# Patient Record
Sex: Female | Born: 1975 | Race: White | Hispanic: No | Marital: Married | Smoking: Never smoker
Health system: Southern US, Community
[De-identification: ages and names within clinical notes are randomized; demographics above are authoritative.]

## PROBLEM LIST (undated history)

## (undated) DIAGNOSIS — R011 Cardiac murmur, unspecified: Secondary | ICD-10-CM

## (undated) DIAGNOSIS — F32A Depression, unspecified: Secondary | ICD-10-CM

## (undated) DIAGNOSIS — F329 Major depressive disorder, single episode, unspecified: Secondary | ICD-10-CM

## (undated) DIAGNOSIS — T7840XA Allergy, unspecified, initial encounter: Secondary | ICD-10-CM

## (undated) DIAGNOSIS — E785 Hyperlipidemia, unspecified: Secondary | ICD-10-CM

## (undated) DIAGNOSIS — G4730 Sleep apnea, unspecified: Secondary | ICD-10-CM

## (undated) HISTORY — DX: Allergy, unspecified, initial encounter: T78.40XA

## (undated) HISTORY — DX: Sleep apnea, unspecified: G47.30

## (undated) HISTORY — DX: Cardiac murmur, unspecified: R01.1

## (undated) HISTORY — DX: Depression, unspecified: F32.A

## (undated) HISTORY — DX: Hyperlipidemia, unspecified: E78.5

---
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
## 2019-03-08 HISTORY — PX: APPENDECTOMY: SHX54

## 2019-04-01 ENCOUNTER — Other Ambulatory Visit: Payer: Self-pay

## 2019-04-02 ENCOUNTER — Encounter: Payer: Self-pay | Admitting: Family Medicine

## 2019-04-02 ENCOUNTER — Other Ambulatory Visit (HOSPITAL_COMMUNITY)
Admission: RE | Admit: 2019-04-02 | Discharge: 2019-04-02 | Disposition: A | Discharge status: Home / Self Care | Payer: Federal, State, Local not specified - PPO | Source: Ambulatory Visit | Attending: Family Medicine | Admitting: Family Medicine

## 2019-04-02 ENCOUNTER — Ambulatory Visit (INDEPENDENT_AMBULATORY_CARE_PROVIDER_SITE_OTHER): Payer: Federal, State, Local not specified - PPO | Admitting: Family Medicine

## 2019-04-02 VITALS — BP 124/78 | HR 81 | Temp 97.8°F | Ht 65.0 in | Wt 206.4 lb

## 2019-04-02 DIAGNOSIS — Z23 Encounter for immunization: Secondary | ICD-10-CM | POA: Diagnosis not present

## 2019-04-02 DIAGNOSIS — Z1231 Encounter for screening mammogram for malignant neoplasm of breast: Secondary | ICD-10-CM

## 2019-04-02 DIAGNOSIS — Z3041 Encounter for surveillance of contraceptive pills: Secondary | ICD-10-CM

## 2019-04-02 DIAGNOSIS — Z0000 Encounter for general adult medical examination without abnormal findings: Secondary | ICD-10-CM

## 2019-04-02 DIAGNOSIS — Z124 Encounter for screening for malignant neoplasm of cervix: Secondary | ICD-10-CM

## 2019-04-02 DIAGNOSIS — F339 Major depressive disorder, recurrent, unspecified: Secondary | ICD-10-CM

## 2019-04-02 LAB — COMPREHENSIVE METABOLIC PANEL
ALT: 10 U/L (ref 0–35)
AST: 15 U/L (ref 0–37)
Albumin: 4.1 g/dL (ref 3.5–5.2)
Alkaline Phosphatase: 72 U/L (ref 39–117)
BUN: 11 mg/dL (ref 6–23)
CO2: 28 mEq/L (ref 19–32)
Calcium: 9 mg/dL (ref 8.4–10.5)
Chloride: 98 mEq/L (ref 96–112)
Creatinine, Ser: 0.76 mg/dL (ref 0.40–1.20)
GFR: 82.73 mL/min (ref >60.00)
Glucose, Bld: 84 mg/dL (ref 70–99)
Potassium: 4.2 mEq/L (ref 3.5–5.1)
Sodium: 132 mEq/L — ABNORMAL LOW (ref 135–145)
Total Bilirubin: 0.3 mg/dL (ref 0.2–1.2)
Total Protein: 7.3 g/dL (ref 6.0–8.3)

## 2019-04-02 LAB — LIPID PANEL
Cholesterol: 234 mg/dL — ABNORMAL HIGH (ref 0–200)
HDL: 63.9 mg/dL (ref >39.00)
LDL Cholesterol: 136 mg/dL — ABNORMAL HIGH (ref 0–99)
NonHDL: 170.29
Total CHOL/HDL Ratio: 4
Triglycerides: 170 mg/dL — ABNORMAL HIGH (ref 0.0–149.0)
VLDL: 34 mg/dL (ref 0.0–40.0)

## 2019-04-02 LAB — CBC WITH DIFFERENTIAL/PLATELET
Basophils Absolute: 0.1 10*3/uL (ref 0.0–0.1)
Basophils Relative: 0.8 % (ref 0.0–3.0)
Eosinophils Absolute: 0.1 10*3/uL (ref 0.0–0.7)
Eosinophils Relative: 1.2 % (ref 0.0–5.0)
HCT: 37.1 % (ref 36.0–46.0)
Hemoglobin: 11.9 g/dL — ABNORMAL LOW (ref 12.0–15.0)
Lymphocytes Relative: 24.5 % (ref 12.0–46.0)
Lymphs Abs: 2.9 10*3/uL (ref 0.7–4.0)
MCHC: 32.1 g/dL (ref 30.0–36.0)
MCV: 77.4 fl — ABNORMAL LOW (ref 78.0–100.0)
Monocytes Absolute: 0.5 10*3/uL (ref 0.1–1.0)
Monocytes Relative: 4.4 % (ref 3.0–12.0)
Neutro Abs: 8.2 10*3/uL — ABNORMAL HIGH (ref 1.4–7.7)
Neutrophils Relative %: 69.1 % (ref 43.0–77.0)
Platelets: 390 10*3/uL (ref 150.0–400.0)
RBC: 4.8 Mil/uL (ref 3.87–5.11)
RDW: 13.9 % (ref 11.5–15.5)
WBC: 11.8 10*3/uL — ABNORMAL HIGH (ref 4.0–10.5)

## 2019-04-02 LAB — TSH: TSH: 1.71 u[IU]/mL (ref 0.35–4.50)

## 2019-04-02 MED ORDER — NORGESTIMATE-ETH ESTRADIOL 0.25-35 MG-MCG PO TABS: 1.0000 | ORAL_TABLET | Freq: Every day | ORAL | 3 refills | Status: DC

## 2019-04-02 NOTE — Progress Notes (Signed)
Subjective  Chief Complaint  Patient presents with  . Verrucous Vulgaris    left thumb  . Contraception  . Annual Exam    HPI: Elaine Casey is a 44 y.o. female who presents to [facility] today for a Female Wellness Visit.  She also has the concerns and/or needs as listed above in the chief complaint. These will be addressed in addition to the Health Maintenance Visit.   Wellness Visit: annual visit with health maintenance review and exam with Pap   HM: due pap. No h/o abnl. Due mammogram. Married with 2 daughters. Uses ocp w/o problems. Needs refill. Due tdap Chronic disease management visit and/or acute problem visit:  Depression: chronic problem now well controlled and managed by Dr. Toy Care.   Assessment  1. Annual physical exam   2. Oral contraceptive pill surveillance   3. Cervical cancer screening   4. Encounter for screening mammogram for breast cancer   5. Major depression, recurrent, chronic ([place])   6. Need for tetanus, diphtheria, and acellular pertussis (Tdap) vaccine      Plan  Female Wellness Visit:  Age appropriate Health Maintenance and Prevention measures were discussed with patient. Included topics are cancer screening recommendations, ways to keep healthy (see AVS) including dietary and exercise recommendations, regular eye and dental care, use of seat belts, and avoidance of moderate alcohol use and tobacco use. Pap w/ HR hPV today. Ordered mammo  BMI: discussed patient's BMI and encouraged positive lifestyle modifications to help get to or maintain a target BMI.  HM needs and immunizations were addressed and ordered. See below for orders. See HM and immunization section for updates. tdap today  Routine labs and screening tests ordered including cmp, cbc and lipids where appropriate.  Discussed recommendations regarding Vit D and calcium supplementation (see AVS)  Chronic disease f/u and/or acute problem visit: (deemed  necessary to be done in addition to the wellness visit):  depression:  Controlled per psych.  Refilled ocps.   Follow up: Return for recheck wart.   Orders Placed This Encounter  Procedures  . MM DIGITAL SCREENING BILATERAL  . Tdap vaccine greater than or equal to 7yo IM  . CBC with Differential/Platelet  . Comprehensive metabolic panel  . Lipid panel  . TSH   Meds ordered this encounter  Medications  . norgestimate-ethinyl estradiol (ORTHO-CYCLEN) 0.25-35 MG-MCG tablet    Sig: Take 1 tablet by mouth daily.    Dispense:  3 Package    Refill:  3      Lifestyle: Body mass index is 34.35 kg/m. Wt Readings from Last 3 Encounters:  04/02/19 206 lb 6.4 oz (93.6 kg)    There are no problems to display for this patient.  Health Maintenance  Topic Date Due  . HIV Screening  05/22/1990  . PAP SMEAR-Modifier  05/21/1996  . TETANUS/TDAP  04/01/2029  . INFLUENZA VACCINE  Completed   Immunization History  Administered Date(s) Administered  . Influenza,inj,Quad PF,6+ Mos 01/04/2019  . Tdap 04/02/2019   We updated and reviewed the patient's past history in detail and it is documented below. Allergies: Patient  reports no history of alcohol use. Past Medical History Patient  has a past medical history of Allergy, Depression, and Heart murmur. Past Surgical History Patient  has no past surgical history on file. Social History   Socioeconomic History  . Marital status: Married    Spouse name: Not on file  . Number of children: 2  .  Years of education: Not on file  . Highest education level: Not on file  Occupational History  . Occupation: psychotherapist  Tobacco Use  . Smoking status: Never Smoker  . Smokeless tobacco: Never Used  Substance and Sexual Activity  . Alcohol use: Never  . Drug use: Never  . Sexual activity: Yes    Birth control/protection: Pill  Other Topics Concern  . Not on file  Social History Narrative   Originally from [place], [place]. Moved from  [place] in 2017; prior to that had lived in [place] for about 6 years.   Husband is an Forensic psychologist.    2 daughters.    Social Determinants of Health   Financial Resource Strain:   . Difficulty of Paying Living Expenses: Not on file  Food Insecurity:   . Worried About Charity fundraiser in the Last Year: Not on file  . Ran Out of Food in the Last Year: Not on file  Transportation Needs:   . Lack of Transportation (Medical): Not on file  . Lack of Transportation (Non-Medical): Not on file  Physical Activity:   . Days of Exercise per Week: Not on file  . Minutes of Exercise per Session: Not on file  Stress:   . Feeling of Stress : Not on file  Social Connections:   . Frequency of Communication with Friends and Family: Not on file  . Frequency of Social Gatherings with Friends and Family: Not on file  . Attends Religious Services: Not on file  . Active Member of Clubs or Organizations: Not on file  . Attends Archivist Meetings: Not on file  . Marital Status: Not on file   Family History  Problem Relation Age of Onset  . Diabetes Mother   . High Cholesterol Mother   . High blood pressure Mother   . COPD Father   . Depression Father   . Learning disabilities Father   . Healthy Daughter   . Healthy Daughter   . Cancer Maternal Grandmother   . Diabetes Maternal Grandmother   . Heart disease Maternal Grandmother   . High Cholesterol Maternal Grandmother   . Stroke Maternal Grandmother   . High blood pressure Maternal Grandmother     Review of Systems: Constitutional: negative for fever or malaise Ophthalmic: negative for photophobia, double vision or loss of vision Cardiovascular: negative for chest pain, dyspnea on exertion, or new LE swelling Respiratory: negative for SOB or persistent cough Gastrointestinal: negative for abdominal pain, change in bowel habits or melena Genitourinary: negative for dysuria or gross hematuria, no abnormal uterine bleeding or  disharge Musculoskeletal: negative for new gait disturbance or muscular weakness Integumentary: negative for new or persistent rashes, no breast lumps Neurological: negative for TIA or stroke symptoms Psychiatric: negative for SI or delusions Allergic/Immunologic: negative for hives  Patient Care Team    Relationship Specialty Notifications Start End  Leamon Arnt, MD PCP - General Family Medicine  04/02/19   Chucky May, MD Consulting Physician Psychiatry  04/02/19     Objective  Vitals: BP 124/78 (BP Location: Right Arm, Patient Position: Sitting, Cuff Size: Normal)   Pulse 81   Temp 97.8 F (36.6 C) (Temporal)   Ht 5\' 5"  (1.651 m)   Wt 206 lb 6.4 oz (93.6 kg)   LMP 03/27/2019   SpO2 99%   BMI 34.35 kg/m  General:  Well developed, well nourished, no acute distress  Psych:  Alert and orientedx3,normal mood and affect HEENT:  Normocephalic, atraumatic, non-icteric sclera, PERRL, oropharynx is clear without mass or exudate, supple neck without adenopathy, mass or thyromegaly Cardiovascular:  Normal S1, S2, RRR without gallop, rub or murmur, nondisplaced PMI Respiratory:  Good breath sounds bilaterally, CTAB with normal respiratory effort Gastrointestinal: normal bowel sounds, soft, non-tender, no noted masses. No HSM MSK: no deformities, contusions. Joints are without erythema or swelling. Spine and CVA region are nontender Skin:  Warm, no rashes or suspicious lesions noted Neurologic:    Mental status is normal. CN 2-11 are normal. Gross motor and sensory exams are normal. Normal gait. No tremor Breast Exam: No mass, skin retraction or nipple discharge is appreciated in either breast. No axillary adenopathy. Fibrocystic changes are not noted Pelvic Exam: Normal external genitalia, no vulvar or vaginal lesions present. Clear cervix w/o CMT. Bimanual exam reveals a nontender fundus w/o masses, nl size. No adnexal masses present. No inguinal adenopathy. A PAP smear was performed.      Commons side effects, risks, benefits, and alternatives for medications and treatment plan prescribed today were discussed, and the patient expressed understanding of the given instructions. Patient is instructed to call or message via MyChart if he/she has any questions or concerns regarding our treatment plan. No barriers to understanding were identified. We discussed Red Flag symptoms and signs in detail. Patient expressed understanding regarding what to do in case of urgent or emergency type symptoms.   Medication list was reconciled, printed and provided to the patient in AVS. Patient instructions and summary information was reviewed with the patient as documented in the AVS. This note was prepared with assistance of Dragon voice recognition software. Occasional wrong-word or sound-a-like substitutions may have occurred due to the inherent limitations of voice recognition software  This visit occurred during the SARS-CoV-2 public health emergency.  Safety protocols were in place, including screening questions prior to the visit, additional usage of staff PPE, and extensive cleaning of exam room while observing appropriate contact time as indicated for disinfecting solutions.

## 2019-04-02 NOTE — Patient Instructions (Addendum)
Please return in 3-4 weeks to recheck your wart.   Please sign up for mychart. I have sent a text with the link. I will release your lab results to you on your MyChart account with further instructions. Please reply with any questions.   I have ordered a mammogram and/or bone density for you as we discussed today: [x]   Mammogram  []   Bone Density  Please call the office checked below to schedule your appointment: Your appointment will at the following location  [x]   [facility]      Ag[facility], [place]         []   [facility]  [place], [place]  Make sure to wear two peace clothing  No lotions powders or deodorants the day of the appointment Make sure to bring picture ID and insurance card.  Bring list of medications you are currently taking including any supplements.   It was a pleasure meeting you today! Thank you for choosing [place] to meet your healthcare needs! I truly look forward to working with you. If you have any questions or concerns, please send me a message via Mychart or call the office at [phone].  Please do these things to maintain good health!   Exercise at least 30-45 minutes a day,  4-5 days a week.   Eat a low-fat diet with lots of fruits and vegetables, up to 7-9 servings per day.  Drink plenty of water daily. Try to drink 8 8oz glasses per day.  Seatbelts can save your life. Always wear your seatbelt.  Place Smoke Detectors on every level of your home and check batteries every year.  Schedule an appointment with an eye doctor for an eye exam every 1-2 years  Safe sex - use condoms to protect yourself from STDs if you could be exposed to these types of infections. Use birth control if you do not want to become pregnant and are sexually active.  Avoid heavy alcohol use. If you drink, keep it to less than 2 drinks/day and not every day.  [employer].  Choose someone you trust that could speak for you if you became unable to speak for yourself.  Depression is common in our stressful world.If you're feeling down or losing interest in things you normally enjoy, please come in for a visit.  If anyone is threatening or hurting you, please get help. Physical or Emotional Violence is never OK.

## 2019-04-04 LAB — CYTOLOGY - PAP
Comment: NEGATIVE
Diagnosis: NEGATIVE
High risk HPV: NEGATIVE

## 2019-04-06 ENCOUNTER — Encounter: Payer: Self-pay | Admitting: Family Medicine

## 2019-04-23 ENCOUNTER — Ambulatory Visit: Payer: Federal, State, Local not specified - PPO | Admitting: Family Medicine

## 2019-04-26 ENCOUNTER — Other Ambulatory Visit: Payer: Self-pay | Admitting: Family Medicine

## 2019-04-26 DIAGNOSIS — Z1231 Encounter for screening mammogram for malignant neoplasm of breast: Secondary | ICD-10-CM

## 2019-05-03 ENCOUNTER — Other Ambulatory Visit: Payer: Self-pay

## 2019-05-03 ENCOUNTER — Ambulatory Visit
Admission: RE | Admit: 2019-05-03 | Discharge: 2019-05-03 | Disposition: A | Discharge status: Home / Self Care | Payer: Federal, State, Local not specified - PPO | Source: Ambulatory Visit

## 2019-05-03 DIAGNOSIS — Z1231 Encounter for screening mammogram for malignant neoplasm of breast: Secondary | ICD-10-CM

## 2019-05-21 ENCOUNTER — Encounter: Payer: Self-pay | Admitting: Family Medicine

## 2019-05-24 ENCOUNTER — Ambulatory Visit: Payer: Federal, State, Local not specified - PPO | Admitting: Family Medicine

## 2019-05-31 ENCOUNTER — Ambulatory Visit: Payer: Federal, State, Local not specified - PPO | Admitting: Family Medicine

## 2019-07-10 ENCOUNTER — Encounter: Payer: Self-pay | Admitting: Family Medicine

## 2019-07-10 ENCOUNTER — Other Ambulatory Visit: Payer: Self-pay

## 2019-07-10 DIAGNOSIS — R0683 Snoring: Secondary | ICD-10-CM

## 2019-07-22 ENCOUNTER — Ambulatory Visit: Payer: Federal, State, Local not specified - PPO | Admitting: Family Medicine

## 2019-07-22 DIAGNOSIS — Z0289 Encounter for other administrative examinations: Secondary | ICD-10-CM

## 2019-08-16 ENCOUNTER — Encounter: Payer: Self-pay | Admitting: Family Medicine

## 2019-09-02 ENCOUNTER — Encounter: Payer: Self-pay | Admitting: Pulmonary Disease

## 2019-09-02 ENCOUNTER — Other Ambulatory Visit: Payer: Self-pay

## 2019-09-02 ENCOUNTER — Ambulatory Visit: Payer: Federal, State, Local not specified - PPO | Admitting: Pulmonary Disease

## 2019-09-02 VITALS — BP 120/68 | HR 84 | Temp 98.2°F | Ht 66.0 in | Wt 206.6 lb

## 2019-09-02 DIAGNOSIS — G4733 Obstructive sleep apnea (adult) (pediatric): Secondary | ICD-10-CM | POA: Insufficient documentation

## 2019-09-02 NOTE — Progress Notes (Signed)
Subjective:    Patient ID: Elaine Casey, female    DOB: 1976-02-18, 44 y.o.   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 Snoring has been noted by her husband for many years.  She now reports excessive daytime fatigue and some level of drowsiness. Epworth sleepiness score is 7 and she reports sleepiness in the afternoons or while sitting and reading.  On weekends she will take naps up to 2 hours and these are refreshing. 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 Bedtime is between 11 PM and midnight, sleep latency is minimal, she sleeps on her right side with 1 pillow, reports 1-2 nocturnal awakenings including nocturia and is out of bed at 7:30 AM feeling refreshed without headaches or dryness of mouth. Her weight has been steady over the past 3 years at around 200 pounds. She drinks [employer], lactate 3-4 times per week and 1-2 sodas per day. There is no history suggestive of cataplexy, sleep paralysis or parasomnias  She shows me a recording of her snoring on her sleep shut eye app, seems to be continuous snoring without paralysis    Past Medical History:  Diagnosis Date   Allergy    Depression    Heart murmur      History reviewed. No pertinent surgical history.  No Known Allergies  Social History   Socioeconomic History   Marital status: Married    Spouse name: Not on file   Number of children: 2   Years of education: Not on file   Highest education level: Not on file  Occupational History   Occupation: psychotherapist  Tobacco Use   Smoking status: Never Smoker   Smokeless tobacco: Never Used  Substance and Sexual Activity   Alcohol use: Never   Drug use: Never   Sexual activity: Yes    Birth control/protection: Pill    Other Topics Concern   Not on file  Social History Narrative   Originally from [place], [place]. Moved from [place] in 2017; prior to that had lived in [place] for about 6 years.   Husband is an Forensic psychologist.    2 daughters.    Social Determinants of Health   Financial Resource Strain:    Difficulty of Paying Living Expenses:   Food Insecurity:    Worried About Charity fundraiser in the Last Year:    Arboriculturist in the Last Year:   Transportation Needs:    Film/video editor (Medical):    Lack of Transportation (Non-Medical):   Physical Activity:    Days of Exercise per Week:    Minutes of Exercise per Session:   Stress:    Feeling of Stress :   Social Connections:    Frequency of Communication with Friends and Family:    Frequency of Social Gatherings with Friends and Family:    Attends Religious Services:    Active Member of Clubs or Organizations:    Attends Music therapist:    Marital Status:   Intimate Partner Violence:    Fear of Current or Ex-Partner:    Emotionally Abused:    Physically Abused:    Sexually Abused:     Family History  Problem Relation Age of Onset   Diabetes Mother  High Cholesterol Mother    High blood pressure Mother    COPD Father    Depression Father    Learning disabilities Father    Healthy Daughter    Healthy Daughter    Cancer Maternal Grandmother    Diabetes Maternal Grandmother    Heart disease Maternal Grandmother    High Cholesterol Maternal Grandmother    Stroke Maternal Grandmother    High blood pressure Maternal Grandmother       Review of Systems Constitutional: negative for anorexia, fevers and sweats  Eyes: negative for irritation, redness and visual disturbance  Ears, nose, mouth, throat, and face: negative for earaches, epistaxis, nasal congestion and sore throat  Respiratory: negative for cough, dyspnea on exertion, sputum and wheezing  Cardiovascular: negative  for chest pain, dyspnea, lower extremity edema, orthopnea, palpitations and syncope  Gastrointestinal: negative for abdominal pain, constipation, diarrhea, melena, nausea and vomiting  Genitourinary:negative for dysuria, frequency and hematuria  Hematologic/lymphatic: negative for bleeding, easy bruising and lymphadenopathy  Musculoskeletal:negative for arthralgias, muscle weakness and stiff joints  Neurological: negative for coordination problems, gait problems, headaches and weakness  Endocrine: negative for diabetic symptoms including polydipsia, polyuria and weight loss     Objective:   Physical Exam   Gen. Pleasant, obese, in no distress ENT - no lesions, no post nasal drip, class 2 airway, tonsillar tissue + Neck: No JVD, no thyromegaly, no carotid bruits Lungs: no use of accessory muscles, no dullness to percussion, decreased without rales or rhonchi  Cardiovascular: Rhythm regular, heart sounds  normal, no murmurs or gallops, no peripheral edema Musculoskeletal: No deformities, no cyanosis or clubbing , no tremors        Assessment & Plan:

## 2019-09-02 NOTE — Assessment & Plan Note (Signed)
Given excessive daytime somnolence, narrow pharyngeal exam, witnessed apneas & loud snoring, obstructive sleep apnea is very likely & an overnight polysomnogram will be scheduled as a home sleep study. The pathophysiology of obstructive sleep apnea , it's cardiovascular consequences & modes of treatment including CPAP were discused with the patient in detail & they evidenced understanding.  Pretest quality is intermediate.  She does not seem to be a mouth breather so if she does need CPAP, nasal interface might suffice.  She would be willing to use a CPAP if needed.  She has significant TMJ issues and has not had much success with a nightguard so she is not very enthusiastic about a dental appliance

## 2019-09-02 NOTE — Patient Instructions (Signed)
Schedule home sleep test. We discussed treatment options Weight loss of 10 to 20 pounds encouraged

## 2019-10-08 ENCOUNTER — Other Ambulatory Visit: Payer: Self-pay

## 2019-10-08 ENCOUNTER — Ambulatory Visit: Payer: Federal, State, Local not specified - PPO

## 2019-10-08 DIAGNOSIS — G4733 Obstructive sleep apnea (adult) (pediatric): Secondary | ICD-10-CM | POA: Diagnosis not present

## 2019-10-11 ENCOUNTER — Telehealth: Payer: Self-pay | Admitting: Pulmonary Disease

## 2019-10-11 DIAGNOSIS — G4733 Obstructive sleep apnea (adult) (pediatric): Secondary | ICD-10-CM

## 2019-10-11 NOTE — Telephone Encounter (Signed)
HST showed very mild OSA, AHI 7/hour. She only slept for 6-1/2 hours  The cardiovascular implications of mild OSA are so minimal that I would not recommend cpap treatment for this degree of sleep disordered breathing. She can pursue dental appliance if her TMJ joint issues are bad  Happy to discuss with her if she has any other questions

## 2019-10-17 NOTE — Telephone Encounter (Signed)
Called and spoke with pt letting her know the results of the HST and she verbalized understanding.nothing further needed.

## 2019-10-17 NOTE — Telephone Encounter (Signed)
Patient is returning phone call. Patient phone number is [phone].

## 2019-10-30 ENCOUNTER — Encounter: Payer: Self-pay | Admitting: Family Medicine

## 2019-12-02 ENCOUNTER — Encounter: Payer: Self-pay | Admitting: Family Medicine

## 2019-12-02 ENCOUNTER — Other Ambulatory Visit: Payer: Self-pay

## 2019-12-02 ENCOUNTER — Ambulatory Visit: Payer: Federal, State, Local not specified - PPO | Admitting: Family Medicine

## 2019-12-02 VITALS — BP 120/82 | HR 91 | Temp 97.8°F | Resp 16 | Ht 66.0 in | Wt 202.6 lb

## 2019-12-02 DIAGNOSIS — Z23 Encounter for immunization: Secondary | ICD-10-CM

## 2019-12-02 DIAGNOSIS — B079 Viral wart, unspecified: Secondary | ICD-10-CM

## 2019-12-02 DIAGNOSIS — B078 Other viral warts: Secondary | ICD-10-CM

## 2019-12-02 NOTE — Progress Notes (Signed)
Subjective  CC:  Chief Complaint  Patient presents with  . Warts    left thumb and middle finger, OTC meds with no relief    HPI: Elaine Casey is a 44 y.o. female who presents to the office today to address  the problems listed above in the chief complaint.  Warts: Growing, left hand, periungual and thumb and middle finger.  She has tried multiple over-the-counter treatments including topical salicylic acid, duct tape, over-the-counter freezing kits.  None have helped.  They are unsightly and get sore and irritated due to the location. Flu shot today Assessment  1. Verruca warts (infectious)      Plan   Warts: Status post cryotherapy x2.  Tolerated well.  Post procedure care instructions given.  Repeat procedure every 2 to 3 weeks until resolution.  Follow up: 2 weeks for cryotherapy Visit date not found  No orders of the defined types were placed in this encounter.  No orders of the defined types were placed in this encounter.     I reviewed the patients updated PMH, FH, and SocHx.    Patient Active Problem List   Diagnosis Date Noted  . OSA (obstructive sleep apnea) 09/02/2019   Current Meds  Medication Sig  . buPROPion (WELLBUTRIN XL) 300 MG 24 hr tablet Take 300 mg by mouth daily.  . cholecalciferol (VITAMIN D3) 25 MCG (1000 UNIT) tablet Take 1,000 Units by mouth daily.  Marland Kitchen escitalopram (LEXAPRO) 20 MG tablet Take 20 mg by mouth daily.  . ferrous sulfate 325 (65 FE) MG tablet Take 325 mg by mouth daily with breakfast.  . lamoTRIgine (LAMICTAL) 150 MG tablet Take 150 mg by mouth daily.  . norgestimate-ethinyl estradiol (ORTHO-CYCLEN) 0.25-35 MG-MCG tablet Take 1 tablet by mouth daily.    Allergies: Patient has No Known Allergies. Family History: Patient family history includes COPD in her father; Cancer in her maternal grandmother; Depression in her father; Diabetes in her maternal grandmother and mother; Healthy in her daughter and daughter; Heart disease in  her maternal grandmother; High Cholesterol in her maternal grandmother and mother; High blood pressure in her maternal grandmother and mother; Learning disabilities in her father; Stroke in her maternal grandmother. Social History:  Patient  reports that she has never smoked. She has never used smokeless tobacco. She reports that she does not drink alcohol and does not use drugs.  Review of Systems: Constitutional: Negative for fever malaise or anorexia Cardiovascular: negative for chest pain Respiratory: negative for SOB or persistent cough Gastrointestinal: negative for abdominal pain  Objective  Vitals: BP 120/82   Pulse 91   Temp 97.8 F (36.6 C) (Temporal)   Resp 16   Ht 5\' 6"  (1.676 m)   Wt 202 lb 9.6 oz (91.9 kg)   SpO2 98%   BMI 32.70 kg/m  General: no acute distress , A&Ox3 Left hand: Middle finger media:  65mm raised verrucous lesion; 4-5 mm warty lesion medial periungual region of thumb.  Cryotherapy Procedure Note  Pre-operative Diagnosis: wart  Post-operative Diagnosis: same  Locations: see description above on left hand  Indications: pain and irritated lesions  Anesthesia: none  Procedure Details   Patient informed of risks (permanent scarring, infection, light or dark discoloration, bleeding, infection, weakness, numbness and recurrence of the lesion) and benefits of the procedure and verbal informed consent obtained. Universal time out performed  The areas are treated with liquid nitrogen therapy, frozen until ice ball extended 2 mm beyond lesion, allowed to thaw, and  treated again. The patient tolerated procedure well.  The patient was instructed on post-op care, warned that there may be blister formation, redness and pain. Recommend OTC analgesia as needed for pain.  Condition: Stable  Complications: none.    Commons side effects, risks, benefits, and alternatives for medications and treatment plan prescribed today were discussed, and the patient  expressed understanding of the given instructions. Patient is instructed to call or message via MyChart if he/she has any questions or concerns regarding our treatment plan. No barriers to understanding were identified. We discussed Red Flag symptoms and signs in detail. Patient expressed understanding regarding what to do in case of urgent or emergency type symptoms.   Medication list was reconciled, printed and provided to the patient in AVS. Patient instructions and summary information was reviewed with the patient as documented in the AVS. This note was prepared with assistance of Dragon voice recognition software. Occasional wrong-word or sound-a-like substitutions may have occurred due to the inherent limitations of voice recognition software  This visit occurred during the SARS-CoV-2 public health emergency.  Safety protocols were in place, including screening questions prior to the visit, additional usage of staff PPE, and extensive cleaning of exam room while observing appropriate contact time as indicated for disinfecting solutions.

## 2019-12-18 ENCOUNTER — Encounter: Payer: Self-pay | Admitting: Family Medicine

## 2019-12-18 ENCOUNTER — Encounter (HOSPITAL_COMMUNITY): Payer: Self-pay

## 2019-12-18 ENCOUNTER — Observation Stay (HOSPITAL_COMMUNITY): Payer: Federal, State, Local not specified - PPO | Admitting: Certified Registered Nurse Anesthetist

## 2019-12-18 ENCOUNTER — Inpatient Hospital Stay (HOSPITAL_COMMUNITY)
Admission: EM | Admit: 2019-12-18 | Discharge: 2019-12-19 | DRG: 340 | Disposition: A | Discharge status: Home / Self Care | Payer: Federal, State, Local not specified - PPO | Attending: Physician Assistant | Admitting: Physician Assistant

## 2019-12-18 ENCOUNTER — Other Ambulatory Visit: Payer: Self-pay

## 2019-12-18 ENCOUNTER — Encounter (HOSPITAL_COMMUNITY): Admission: EM | Disposition: A | Discharge status: Home / Self Care | Payer: Self-pay | Source: Home / Self Care

## 2019-12-18 ENCOUNTER — Emergency Department (HOSPITAL_COMMUNITY): Payer: Federal, State, Local not specified - PPO

## 2019-12-18 ENCOUNTER — Ambulatory Visit: Payer: Federal, State, Local not specified - PPO | Admitting: Family Medicine

## 2019-12-18 DIAGNOSIS — N7093 Salpingitis and oophoritis, unspecified: Secondary | ICD-10-CM | POA: Diagnosis present

## 2019-12-18 DIAGNOSIS — F32A Depression, unspecified: Secondary | ICD-10-CM | POA: Diagnosis present

## 2019-12-18 DIAGNOSIS — Z823 Family history of stroke: Secondary | ICD-10-CM | POA: Diagnosis not present

## 2019-12-18 DIAGNOSIS — Z83438 Family history of other disorder of lipoprotein metabolism and other lipidemia: Secondary | ICD-10-CM

## 2019-12-18 DIAGNOSIS — Z825 Family history of asthma and other chronic lower respiratory diseases: Secondary | ICD-10-CM | POA: Diagnosis not present

## 2019-12-18 DIAGNOSIS — R3911 Hesitancy of micturition: Secondary | ICD-10-CM | POA: Diagnosis present

## 2019-12-18 DIAGNOSIS — K3532 Acute appendicitis with perforation and localized peritonitis, without abscess: Secondary | ICD-10-CM | POA: Diagnosis present

## 2019-12-18 DIAGNOSIS — Z833 Family history of diabetes mellitus: Secondary | ICD-10-CM | POA: Diagnosis not present

## 2019-12-18 DIAGNOSIS — Z8249 Family history of ischemic heart disease and other diseases of the circulatory system: Secondary | ICD-10-CM

## 2019-12-18 DIAGNOSIS — K3580 Unspecified acute appendicitis: Secondary | ICD-10-CM | POA: Diagnosis present

## 2019-12-18 DIAGNOSIS — Z20822 Contact with and (suspected) exposure to covid-19: Secondary | ICD-10-CM | POA: Diagnosis present

## 2019-12-18 HISTORY — PX: LAPAROSCOPIC APPENDECTOMY: SHX408

## 2019-12-18 LAB — URINALYSIS, ROUTINE W REFLEX MICROSCOPIC
Bilirubin Urine: NEGATIVE
Glucose, UA: NEGATIVE mg/dL
Hgb urine dipstick: NEGATIVE
Ketones, ur: 20 mg/dL — AB
Leukocytes,Ua: NEGATIVE
Nitrite: NEGATIVE
Protein, ur: NEGATIVE mg/dL
Specific Gravity, Urine: 1.017 (ref 1.005–1.030)
pH: 5 (ref 5.0–8.0)

## 2019-12-18 LAB — COMPREHENSIVE METABOLIC PANEL
ALT: 12 U/L (ref 0–44)
AST: 16 U/L (ref 15–41)
Albumin: 3.5 g/dL (ref 3.5–5.0)
Alkaline Phosphatase: 62 U/L (ref 38–126)
Anion gap: 10 (ref 5–15)
BUN: 6 mg/dL (ref 6–20)
CO2: 23 mmol/L (ref 22–32)
Calcium: 8.8 mg/dL — ABNORMAL LOW (ref 8.9–10.3)
Chloride: 101 mmol/L (ref 98–111)
Creatinine, Ser: 0.87 mg/dL (ref 0.44–1.00)
GFR, Estimated: >60 mL/min (ref >60)
Glucose, Bld: 130 mg/dL — ABNORMAL HIGH (ref 70–99)
Potassium: 4 mmol/L (ref 3.5–5.1)
Sodium: 134 mmol/L — ABNORMAL LOW (ref 135–145)
Total Bilirubin: 0.6 mg/dL (ref 0.3–1.2)
Total Protein: 7 g/dL (ref 6.5–8.1)

## 2019-12-18 LAB — CREATININE, SERUM
Creatinine, Ser: 0.75 mg/dL (ref 0.44–1.00)
GFR, Estimated: >60 mL/min (ref >60)

## 2019-12-18 LAB — CBC
HCT: 38 % (ref 36.0–46.0)
HCT: 37.3 % (ref 36.0–46.0)
Hemoglobin: 11.7 g/dL — ABNORMAL LOW (ref 12.0–15.0)
Hemoglobin: 11.4 g/dL — ABNORMAL LOW (ref 12.0–15.0)
MCH: 25.5 pg — ABNORMAL LOW (ref 26.0–34.0)
MCH: 25.4 pg — ABNORMAL LOW (ref 26.0–34.0)
MCHC: 30.8 g/dL (ref 30.0–36.0)
MCHC: 30.6 g/dL (ref 30.0–36.0)
MCV: 83 fL (ref 80.0–100.0)
MCV: 83.1 fL (ref 80.0–100.0)
Platelets: 322 10*3/uL (ref 150–400)
Platelets: 298 10*3/uL (ref 150–400)
RBC: 4.58 MIL/uL (ref 3.87–5.11)
RBC: 4.49 MIL/uL (ref 3.87–5.11)
RDW: 13.5 % (ref 11.5–15.5)
RDW: 13.6 % (ref 11.5–15.5)
WBC: 26.2 10*3/uL — ABNORMAL HIGH (ref 4.0–10.5)
WBC: 23.3 10*3/uL — ABNORMAL HIGH (ref 4.0–10.5)
nRBC: 0 % (ref 0.0–0.2)
nRBC: 0 % (ref 0.0–0.2)

## 2019-12-18 LAB — I-STAT BETA HCG BLOOD, ED (MC, WL, AP ONLY): I-stat hCG, quantitative: <5 m[IU]/mL (ref <5)

## 2019-12-18 LAB — RESPIRATORY PANEL BY RT PCR (FLU A&B, COVID)
Influenza A by PCR: NEGATIVE
Influenza B by PCR: NEGATIVE
SARS Coronavirus 2 by RT PCR: NEGATIVE

## 2019-12-18 LAB — LIPASE, BLOOD: Lipase: 27 U/L (ref 11–51)

## 2019-12-18 SURGERY — APPENDECTOMY, LAPAROSCOPIC
Anesthesia: General | Site: Abdomen

## 2019-12-18 MED ORDER — BUPIVACAINE HCL (PF) 0.25 % IJ SOLN
INTRAMUSCULAR | Status: AC
  Filled 2019-12-18: qty 30

## 2019-12-18 MED ORDER — ACETAMINOPHEN 500 MG PO TABS
1000.0000 mg | ORAL_TABLET | Freq: Four times a day (QID) | ORAL | Status: DC
  Administered 2019-12-18 – 2019-12-19 (×3): 1000 mg via ORAL
  Filled 2019-12-18 (×3): qty 2

## 2019-12-18 MED ORDER — FENTANYL CITRATE (PF) 100 MCG/2ML IJ SOLN
INTRAMUSCULAR | Status: DC | PRN
Start: 2019-12-18 — End: 2019-12-18
  Administered 2019-12-18 (×3): 50 ug via INTRAVENOUS

## 2019-12-18 MED ORDER — ESCITALOPRAM OXALATE 20 MG PO TABS
20.0000 mg | ORAL_TABLET | Freq: Every day | ORAL | Status: DC
  Administered 2019-12-18 – 2019-12-19 (×2): 20 mg via ORAL
  Filled 2019-12-18 (×2): qty 1

## 2019-12-18 MED ORDER — MORPHINE SULFATE (PF) 2 MG/ML IV SOLN: 2.0000 mg | INTRAVENOUS | Status: DC | PRN

## 2019-12-18 MED ORDER — PHENYLEPHRINE 40 MCG/ML (10ML) SYRINGE FOR IV PUSH (FOR BLOOD PRESSURE SUPPORT)
PREFILLED_SYRINGE | INTRAVENOUS | Status: DC | PRN
  Administered 2019-12-18 (×2): 80 ug via INTRAVENOUS

## 2019-12-18 MED ORDER — MIDAZOLAM HCL 2 MG/2ML IJ SOLN
INTRAMUSCULAR | Status: AC
  Filled 2019-12-18: qty 2

## 2019-12-18 MED ORDER — LIDOCAINE 2% (20 MG/ML) 5 ML SYRINGE
INTRAMUSCULAR | Status: AC
  Filled 2019-12-18: qty 5

## 2019-12-18 MED ORDER — ENOXAPARIN SODIUM 40 MG/0.4ML ~~LOC~~ SOLN: 40.0000 mg | SUBCUTANEOUS | Status: DC

## 2019-12-18 MED ORDER — FENTANYL CITRATE (PF) 100 MCG/2ML IJ SOLN
50.0000 ug | Freq: Once | INTRAMUSCULAR | Status: AC
  Administered 2019-12-18: 50 ug via INTRAVENOUS
  Filled 2019-12-18: qty 2

## 2019-12-18 MED ORDER — LIDOCAINE 2% (20 MG/ML) 5 ML SYRINGE
INTRAMUSCULAR | Status: DC | PRN
  Administered 2019-12-18: 60 mg via INTRAVENOUS

## 2019-12-18 MED ORDER — PIPERACILLIN-TAZOBACTAM 3.375 G IVPB
3.3750 g | Freq: Three times a day (TID) | INTRAVENOUS | Status: DC
  Administered 2019-12-18 – 2019-12-19 (×3): 3.375 g via INTRAVENOUS
  Filled 2019-12-18 (×3): qty 50

## 2019-12-18 MED ORDER — SODIUM CHLORIDE 0.9 % IV SOLN: INTRAVENOUS | Status: DC

## 2019-12-18 MED ORDER — OXYCODONE-ACETAMINOPHEN 5-325 MG PO TABS
1.0000 | ORAL_TABLET | ORAL | Status: DC | PRN
  Administered 2019-12-18: 1 via ORAL
  Filled 2019-12-18: qty 1

## 2019-12-18 MED ORDER — KETOROLAC TROMETHAMINE 30 MG/ML IJ SOLN
30.0000 mg | Freq: Four times a day (QID) | INTRAMUSCULAR | Status: DC | PRN
  Administered 2019-12-18: 30 mg via INTRAVENOUS
  Filled 2019-12-18: qty 1

## 2019-12-18 MED ORDER — PHENYLEPHRINE HCL-NACL 10-0.9 MG/250ML-% IV SOLN
INTRAVENOUS | Status: DC | PRN
  Administered 2019-12-18: 25 ug/min via INTRAVENOUS

## 2019-12-18 MED ORDER — IOHEXOL 300 MG/ML  SOLN
80.0000 mL | Freq: Once | INTRAMUSCULAR | Status: AC | PRN
  Administered 2019-12-18: 80 mL via INTRAVENOUS

## 2019-12-18 MED ORDER — METOPROLOL TARTRATE 5 MG/5ML IV SOLN: 5.0000 mg | Freq: Four times a day (QID) | INTRAVENOUS | Status: DC | PRN

## 2019-12-18 MED ORDER — NORGESTIMATE-ETH ESTRADIOL 0.25-35 MG-MCG PO TABS: 1.0000 | ORAL_TABLET | Freq: Every day | ORAL | Status: DC

## 2019-12-18 MED ORDER — CHLORHEXIDINE GLUCONATE 0.12 % MT SOLN: 15.0000 mL | Freq: Once | OROMUCOSAL | Status: AC

## 2019-12-18 MED ORDER — SUGAMMADEX SODIUM 200 MG/2ML IV SOLN
INTRAVENOUS | Status: DC | PRN
  Administered 2019-12-18: 200 mg via INTRAVENOUS

## 2019-12-18 MED ORDER — ROCURONIUM BROMIDE 10 MG/ML (PF) SYRINGE
PREFILLED_SYRINGE | INTRAVENOUS | Status: DC | PRN
  Administered 2019-12-18: 30 mg via INTRAVENOUS

## 2019-12-18 MED ORDER — OXYCODONE HCL 5 MG PO TABS: 5.0000 mg | ORAL_TABLET | ORAL | Status: DC | PRN

## 2019-12-18 MED ORDER — PROPOFOL 10 MG/ML IV BOLUS
INTRAVENOUS | Status: AC
  Filled 2019-12-18: qty 20

## 2019-12-18 MED ORDER — ACETAMINOPHEN 10 MG/ML IV SOLN
INTRAVENOUS | Status: AC
  Filled 2019-12-18: qty 100

## 2019-12-18 MED ORDER — ONDANSETRON HCL 4 MG/2ML IJ SOLN
INTRAMUSCULAR | Status: DC | PRN
  Administered 2019-12-18: 4 mg via INTRAVENOUS

## 2019-12-18 MED ORDER — CHLORHEXIDINE GLUCONATE 0.12 % MT SOLN
OROMUCOSAL | Status: AC
  Administered 2019-12-18: 15 mL via OROMUCOSAL
  Filled 2019-12-18: qty 15

## 2019-12-18 MED ORDER — SODIUM CHLORIDE 0.9 % IR SOLN
Status: DC | PRN
  Administered 2019-12-18: 3000 mL
  Administered 2019-12-18: 1000 mL

## 2019-12-18 MED ORDER — SUCCINYLCHOLINE CHLORIDE 200 MG/10ML IV SOSY
PREFILLED_SYRINGE | INTRAVENOUS | Status: DC | PRN
  Administered 2019-12-18: 120 mg via INTRAVENOUS

## 2019-12-18 MED ORDER — DIPHENHYDRAMINE HCL 25 MG PO CAPS: 25.0000 mg | ORAL_CAPSULE | Freq: Four times a day (QID) | ORAL | Status: DC | PRN

## 2019-12-18 MED ORDER — ONDANSETRON HCL 4 MG/2ML IJ SOLN: 4.0000 mg | Freq: Four times a day (QID) | INTRAMUSCULAR | Status: DC | PRN

## 2019-12-18 MED ORDER — MORPHINE SULFATE (PF) 4 MG/ML IV SOLN
4.0000 mg | Freq: Once | INTRAVENOUS | Status: AC
  Administered 2019-12-18: 4 mg via INTRAVENOUS
  Filled 2019-12-18: qty 1

## 2019-12-18 MED ORDER — ONDANSETRON 4 MG PO TBDP
4.0000 mg | ORAL_TABLET | Freq: Once | ORAL | Status: DC | PRN
  Filled 2019-12-18: qty 1

## 2019-12-18 MED ORDER — PROPOFOL 10 MG/ML IV BOLUS
INTRAVENOUS | Status: DC | PRN
  Administered 2019-12-18: 150 mg via INTRAVENOUS

## 2019-12-18 MED ORDER — DEXAMETHASONE SODIUM PHOSPHATE 10 MG/ML IJ SOLN
INTRAMUSCULAR | Status: DC | PRN
  Administered 2019-12-18: 4 mg via INTRAVENOUS

## 2019-12-18 MED ORDER — ROCURONIUM BROMIDE 10 MG/ML (PF) SYRINGE
PREFILLED_SYRINGE | INTRAVENOUS | Status: AC
  Filled 2019-12-18: qty 10

## 2019-12-18 MED ORDER — LACTATED RINGERS IV SOLN: INTRAVENOUS | Status: DC

## 2019-12-18 MED ORDER — SCOPOLAMINE 1 MG/3DAYS TD PT72
MEDICATED_PATCH | TRANSDERMAL | Status: DC | PRN
  Administered 2019-12-18: 1 via TRANSDERMAL

## 2019-12-18 MED ORDER — ACETAMINOPHEN 10 MG/ML IV SOLN
INTRAVENOUS | Status: DC | PRN
  Administered 2019-12-18: 1000 mg via INTRAVENOUS

## 2019-12-18 MED ORDER — ONDANSETRON 4 MG PO TBDP: 4.0000 mg | ORAL_TABLET | Freq: Four times a day (QID) | ORAL | Status: DC | PRN

## 2019-12-18 MED ORDER — FENTANYL CITRATE (PF) 100 MCG/2ML IJ SOLN: 50.0000 ug | Freq: Once | INTRAMUSCULAR | Status: DC

## 2019-12-18 MED ORDER — MIDAZOLAM HCL 5 MG/5ML IJ SOLN
INTRAMUSCULAR | Status: DC | PRN
  Administered 2019-12-18: 2 mg via INTRAVENOUS

## 2019-12-18 MED ORDER — VASOPRESSIN 20 UNIT/ML IV SOLN
INTRAVENOUS | Status: AC
  Filled 2019-12-18: qty 1

## 2019-12-18 MED ORDER — KETOROLAC TROMETHAMINE 30 MG/ML IJ SOLN
INTRAMUSCULAR | Status: AC
  Filled 2019-12-18: qty 2

## 2019-12-18 MED ORDER — FENTANYL CITRATE (PF) 100 MCG/2ML IJ SOLN: 50.0000 ug | INTRAMUSCULAR | Status: DC | PRN

## 2019-12-18 MED ORDER — SCOPOLAMINE 1 MG/3DAYS TD PT72
MEDICATED_PATCH | TRANSDERMAL | Status: AC
  Filled 2019-12-18: qty 1

## 2019-12-18 MED ORDER — SODIUM CHLORIDE 0.9 % IV BOLUS
1000.0000 mL | Freq: Once | INTRAVENOUS | Status: AC
  Administered 2019-12-18: 1000 mL via INTRAVENOUS

## 2019-12-18 MED ORDER — BUPROPION HCL ER (XL) 300 MG PO TB24
300.0000 mg | ORAL_TABLET | Freq: Every day | ORAL | Status: DC
  Administered 2019-12-18 – 2019-12-19 (×2): 300 mg via ORAL
  Filled 2019-12-18 (×2): qty 1

## 2019-12-18 MED ORDER — FENTANYL CITRATE (PF) 250 MCG/5ML IJ SOLN
INTRAMUSCULAR | Status: AC
  Filled 2019-12-18: qty 5

## 2019-12-18 MED ORDER — 0.9 % SODIUM CHLORIDE (POUR BTL) OPTIME
TOPICAL | Status: DC | PRN
  Administered 2019-12-18: 1000 mL

## 2019-12-18 MED ORDER — DEXAMETHASONE SODIUM PHOSPHATE 10 MG/ML IJ SOLN
INTRAMUSCULAR | Status: AC
  Filled 2019-12-18: qty 1

## 2019-12-18 MED ORDER — ONDANSETRON HCL 4 MG/2ML IJ SOLN: 4.0000 mg | INTRAMUSCULAR | Status: DC | PRN

## 2019-12-18 MED ORDER — LAMOTRIGINE 150 MG PO TABS
150.0000 mg | ORAL_TABLET | Freq: Every day | ORAL | Status: DC
  Administered 2019-12-18 – 2019-12-19 (×2): 150 mg via ORAL
  Filled 2019-12-18 (×2): qty 1

## 2019-12-18 MED ORDER — ONDANSETRON HCL 4 MG/2ML IJ SOLN
INTRAMUSCULAR | Status: AC
  Filled 2019-12-18: qty 2

## 2019-12-18 MED ORDER — KETOROLAC TROMETHAMINE 30 MG/ML IJ SOLN
INTRAMUSCULAR | Status: DC | PRN
  Administered 2019-12-18: 30 mg via INTRAVENOUS

## 2019-12-18 MED ORDER — LORAZEPAM 2 MG/ML IJ SOLN
1.0000 mg | Freq: Once | INTRAMUSCULAR | Status: AC
  Administered 2019-12-18: 1 mg via INTRAVENOUS
  Filled 2019-12-18: qty 1

## 2019-12-18 MED ORDER — DIPHENHYDRAMINE HCL 50 MG/ML IJ SOLN: 25.0000 mg | Freq: Four times a day (QID) | INTRAMUSCULAR | Status: DC | PRN

## 2019-12-18 MED ORDER — FERROUS SULFATE 325 (65 FE) MG PO TABS
325.0000 mg | ORAL_TABLET | Freq: Every day | ORAL | Status: DC
  Administered 2019-12-19: 325 mg via ORAL
  Filled 2019-12-18: qty 1

## 2019-12-18 MED ORDER — BUPIVACAINE HCL 0.25 % IJ SOLN
INTRAMUSCULAR | Status: DC | PRN
  Administered 2019-12-18: 30 mL

## 2019-12-18 SURGICAL SUPPLY — 51 items
APPLIER CLIP ROT 10 11.4 M/L (STAPLE)
BLADE CLIPPER SURG (BLADE) IMPLANT
CANISTER SUCT 3000ML PPV (MISCELLANEOUS) ×3 IMPLANT
CHLORAPREP W/TINT 26 (MISCELLANEOUS) ×3 IMPLANT
CLIP APPLIE ROT 10 11.4 M/L (STAPLE) IMPLANT
CLOSURE STERI-STRIP 1/2X4 (GAUZE/BANDAGES/DRESSINGS) ×1
CLOSURE WOUND 1/2 X4 (GAUZE/BANDAGES/DRESSINGS)
CLSR STERI-STRIP ANTIMIC 1/2X4 (GAUZE/BANDAGES/DRESSINGS) ×2 IMPLANT
COVER SURGICAL LIGHT HANDLE (MISCELLANEOUS) ×3 IMPLANT
COVER WAND RF STERILE (DRAPES) ×3 IMPLANT
CUTTER FLEX LINEAR 45M (STAPLE) ×3 IMPLANT
DERMABOND ADVANCED (GAUZE/BANDAGES/DRESSINGS)
DERMABOND ADVANCED .7 DNX12 (GAUZE/BANDAGES/DRESSINGS) IMPLANT
DRSG TEGADERM 2-3/8X2-3/4 SM (GAUZE/BANDAGES/DRESSINGS) ×3 IMPLANT
DRSG TEGADERM 4X4.75 (GAUZE/BANDAGES/DRESSINGS) IMPLANT
ELECT REM PT RETURN 9FT ADLT (ELECTROSURGICAL) ×3
ELECTRODE REM PT RTRN 9FT ADLT (ELECTROSURGICAL) ×1 IMPLANT
ENDOLOOP SUT PDS II  0 18 (SUTURE)
ENDOLOOP SUT PDS II 0 18 (SUTURE) IMPLANT
GLOVE BIOGEL M STRL SZ7.5 (GLOVE) ×3 IMPLANT
GLOVE INDICATOR 8.0 STRL GRN (GLOVE) ×6 IMPLANT
GOWN STRL REUS W/ TWL LRG LVL3 (GOWN DISPOSABLE) ×2 IMPLANT
GOWN STRL REUS W/TWL 2XL LVL3 (GOWN DISPOSABLE) ×3 IMPLANT
GOWN STRL REUS W/TWL LRG LVL3 (GOWN DISPOSABLE) ×4
GRASPER SUT TROCAR 14GX15 (MISCELLANEOUS) IMPLANT
KIT BASIN OR (CUSTOM PROCEDURE TRAY) ×3 IMPLANT
KIT TURNOVER KIT B (KITS) ×3 IMPLANT
NS IRRIG 1000ML POUR BTL (IV SOLUTION) ×3 IMPLANT
PAD ARMBOARD 7.5X6 YLW CONV (MISCELLANEOUS) ×6 IMPLANT
POUCH RETRIEVAL ECOSAC 10 (ENDOMECHANICALS) ×1 IMPLANT
POUCH RETRIEVAL ECOSAC 10MM (ENDOMECHANICALS) ×2
RELOAD 45 VASCULAR/THIN (ENDOMECHANICALS) ×3 IMPLANT
RELOAD STAPLE TA45 3.5 REG BLU (ENDOMECHANICALS) IMPLANT
SCISSORS LAP 5X35 DISP (ENDOMECHANICALS) IMPLANT
SET IRRIG TUBING LAPAROSCOPIC (IRRIGATION / IRRIGATOR) ×3 IMPLANT
SET TUBE SMOKE EVAC HIGH FLOW (TUBING) ×3 IMPLANT
SHEARS HARMONIC ACE PLUS 36CM (ENDOMECHANICALS) ×3 IMPLANT
SLEEVE ENDOPATH XCEL 5M (ENDOMECHANICALS) ×3 IMPLANT
SPONGE GAUZE 2X2 8PLY STER LF (GAUZE/BANDAGES/DRESSINGS) ×1
SPONGE GAUZE 2X2 8PLY STRL LF (GAUZE/BANDAGES/DRESSINGS) ×2 IMPLANT
STRIP CLOSURE SKIN 1/2X4 (GAUZE/BANDAGES/DRESSINGS) IMPLANT
SUT MNCRL AB 4-0 PS2 18 (SUTURE) ×3 IMPLANT
SUT VICRYL 0 UR6 27IN ABS (SUTURE) ×6 IMPLANT
TOWEL GREEN STERILE (TOWEL DISPOSABLE) ×3 IMPLANT
TOWEL GREEN STERILE FF (TOWEL DISPOSABLE) ×3 IMPLANT
TRAY FOLEY W/BAG SLVR 16FR (SET/KITS/TRAYS/PACK)
TRAY FOLEY W/BAG SLVR 16FR ST (SET/KITS/TRAYS/PACK) IMPLANT
TRAY LAPAROSCOPIC MC (CUSTOM PROCEDURE TRAY) ×3 IMPLANT
TROCAR XCEL BLUNT TIP 100MML (ENDOMECHANICALS) ×3 IMPLANT
TROCAR XCEL NON-BLD 5MMX100MML (ENDOMECHANICALS) ×3 IMPLANT
WATER STERILE IRR 1000ML POUR (IV SOLUTION) ×3 IMPLANT

## 2019-12-18 NOTE — ED Notes (Signed)
Patient transported to CT 

## 2019-12-18 NOTE — Anesthesia Preprocedure Evaluation (Addendum)
Anesthesia Evaluation  Patient identified by MRN, date of birth, ID band Patient awake    Reviewed: Allergy & Precautions, H&P , NPO status , Patient's Chart, lab work & pertinent test results  Airway Mallampati: III  TM Distance: >3 FB Neck ROM: Full  Mouth opening: Limited Mouth Opening  Dental no notable dental hx. (+) Teeth Intact, Dental Advisory Given   Pulmonary neg pulmonary ROS,    Pulmonary exam normal breath sounds clear to auscultation       Cardiovascular negative cardio ROS   Rhythm:Regular Rate:Normal     Neuro/Psych Depression negative neurological ROS     GI/Hepatic negative GI ROS, Neg liver ROS,   Endo/Other  negative endocrine ROS  Renal/GU negative Renal ROS  negative genitourinary   Musculoskeletal   Abdominal   Peds  Hematology negative hematology ROS (+)   Anesthesia Other Findings   Reproductive/Obstetrics negative OB ROS                            Anesthesia Physical Anesthesia Plan  ASA: II  Anesthesia Plan: General   Post-op Pain Management:    Induction: Intravenous, Rapid sequence and Cricoid pressure planned  PONV Risk Score and Plan: 4 or greater and Ondansetron, Dexamethasone and Midazolam  Airway Management Planned: Oral ETT  Additional Equipment:   Intra-op Plan:   Post-operative Plan: Extubation in OR  Informed Consent: I have reviewed the patients History and Physical, chart, labs and discussed the procedure including the risks, benefits and alternatives for the proposed anesthesia with the patient or authorized representative who has indicated his/her understanding and acceptance.     Dental advisory given  Plan Discussed with: CRNA  Anesthesia Plan Comments:         Anesthesia Quick Evaluation

## 2019-12-18 NOTE — Anesthesia Procedure Notes (Signed)
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

## 2019-12-18 NOTE — ED Notes (Signed)
IV team at bedside 

## 2019-12-18 NOTE — H&P (Signed)
[facility] Admission Note  Elaine Casey [birth year]  [id number].    Requesting MD: Domenic Moras PA-C Chief Complaint/Reason for Consult: acute appendicitis HPI:  Patient is a 44 year old female who presented to [facility] with abdominal pain that started yesterday AM. She reports pain is across lower abdomen and constant. Feels like a pressure which she compares to labor, with some intermittent episodes of sharp pain. She reports associated nausea and dry heaving, fever up to 101 at home, chills, and some urinary hesitancy. She denies chest pain, SOB, dysuria, diarrhea. PMH otherwise significant for depression and hx of heart murmur. No blood thinning medications. Patient has not had any past surgery. NKDA. Denies daily alcohol use, tobacco use or illicit drug use. She works as a Transport planner. Has had both COVID vaccines and denies sick contacts. Husband at bedside in ED.   ROS: Review of Systems  Constitutional: Positive for chills and fever.  Respiratory: Negative for shortness of breath and wheezing.   Cardiovascular: Negative for chest pain and palpitations.  Gastrointestinal: Positive for abdominal pain, nausea and vomiting. Negative for blood in stool, constipation and diarrhea.  Genitourinary: Negative for dysuria, frequency and urgency.  All other systems reviewed and are negative.   Family History  Problem Relation Age of Onset  . Diabetes Mother   . High Cholesterol Mother   . High blood pressure Mother   . COPD Father   . Depression Father   . Learning disabilities Father   . Healthy Daughter   . Healthy Daughter   . Cancer Maternal Grandmother   . Diabetes Maternal Grandmother   . Heart disease Maternal Grandmother   . High Cholesterol Maternal Grandmother   . Stroke Maternal Grandmother   . High blood pressure Maternal Grandmother     Past Medical History:  Diagnosis Date  . Allergy   . Depression   . Heart murmur     No past surgical history on  file.  Social History:  reports that she has never smoked. She has never used smokeless tobacco. She reports that she does not drink alcohol and does not use drugs.  Allergies: No Known Allergies  (Not in a hospital admission)   Blood pressure 128/76, pulse (!) 101, temperature 99.8 F (37.7 C), temperature source Oral, resp. rate 20, last menstrual period 12/04/2019, SpO2 96 %. Physical Exam:  General: pleasant, WD, overweight female who is laying in bed in NAD HEENT:  Sclera are noninjected.  PERRL.  Ears and nose without any masses or lesions.  Mouth is pink and moist Heart: regular, rate, and rhythm.  Normal s1,s2. No obvious murmurs, gallops, or rubs noted.  Palpable radial and pedal pulses bilaterally Lungs: CTAB, no wheezes, rhonchi, or rales noted.  Respiratory effort nonlabored Abd: soft, ttp in RLQ without peritonitis, ND, +BS, no masses, hernias, or organomegaly MS: all 4 extremities are symmetrical with no cyanosis, clubbing, or edema. Skin: warm and dry with no masses, lesions, or rashes Neuro: Cranial nerves 2-12 grossly intact, sensation grossly intact throughout Psych: A&Ox3 with an appropriate affect.   Results for orders placed or performed during the hospital encounter of 12/18/19 (from the past 48 hour(s))  Lipase, blood     Status: None   Collection Time: 12/18/19  1:42 AM  Result Value Ref Range   Lipase 27 11 - 51 U/L    Comment: Performed at [facility], [place] [address], [place], [place] [address]  Comprehensive metabolic panel  Status: Abnormal   Collection Time: 12/18/19  1:42 AM  Result Value Ref Range   Sodium 134 (L) 135 - 145 mmol/L   Potassium 4.0 3.5 - 5.1 mmol/L   Chloride 101 98 - 111 mmol/L   CO2 23 22 - 32 mmol/L   Glucose, Bld 130 (H) 70 - 99 mg/dL    Comment: Glucose reference range applies only to samples taken after fasting for at least 8 hours.   BUN 6 6 - 20 mg/dL   Creatinine, Ser 0.87 0.44 - 1.00 mg/dL   Calcium 8.8 (L) 8.9 -  10.3 mg/dL   Total Protein 7.0 6.5 - 8.1 g/dL   Albumin 3.5 3.5 - 5.0 g/dL   AST 16 15 - 41 U/L   ALT 12 0 - 44 U/L   Alkaline Phosphatase 62 38 - 126 U/L   Total Bilirubin 0.6 0.3 - 1.2 mg/dL   GFR, Estimated >60 >60 mL/min   Anion gap 10 5 - 15    Comment: Performed at [facility], [place] [address] [place], [place] [address]  CBC     Status: Abnormal   Collection Time: 12/18/19  1:42 AM  Result Value Ref Range   WBC 26.2 (H) 4.0 - 10.5 K/uL   RBC 4.58 3.87 - 5.11 MIL/uL   Hemoglobin 11.7 (L) 12.0 - 15.0 g/dL   HCT 38.0 36 - 46 %   MCV 83.0 80.0 - 100.0 fL   MCH 25.5 (L) 26.0 - 34.0 pg   MCHC 30.8 30.0 - 36.0 g/dL   RDW 13.5 11.5 - 15.5 %   Platelets 322 150 - 400 K/uL   nRBC 0.0 0.0 - 0.2 %    Comment: Performed at [facility], [place] [address], [place], [place] [address]  I-Stat beta hCG blood, ED     Status: None   Collection Time: 12/18/19  1:50 AM  Result Value Ref Range   I-stat hCG, quantitative <5.0 <5 mIU/mL   Comment 3            Comment:   GEST. AGE      CONC.  (mIU/mL)   <=1 WEEK        5 - 50     2 WEEKS       50 - 500     3 WEEKS       100 - 10,000     4 WEEKS     1,000 - 30,000        FEMALE AND NON-PREGNANT FEMALE:     LESS THAN 5 mIU/mL   Urinalysis, Routine w reflex microscopic Urine, Clean Catch     Status: Abnormal   Collection Time: 12/18/19  3:05 AM  Result Value Ref Range   Color, Urine YELLOW YELLOW   APPearance HAZY (A) CLEAR   Specific Gravity, Urine 1.017 1.005 - 1.030   pH 5.0 5.0 - 8.0   Glucose, UA NEGATIVE NEGATIVE mg/dL   Hgb urine dipstick NEGATIVE NEGATIVE   Bilirubin Urine NEGATIVE NEGATIVE   Ketones, ur 20 (A) NEGATIVE mg/dL   Protein, ur NEGATIVE NEGATIVE mg/dL   Nitrite NEGATIVE NEGATIVE   Leukocytes,Ua NEGATIVE NEGATIVE    Comment: Performed at [facility], [place] [address] [place], [place] [address]   CT Abdomen Pelvis W Contrast  Result Date: 12/18/2019 CLINICAL DATA:  Lower abdominal pain with  leukocytosis. EXAM: CT ABDOMEN AND PELVIS WITH CONTRAST TECHNIQUE: Multidetector CT imaging of the abdomen and pelvis  was performed using the standard protocol following bolus administration of intravenous contrast. CONTRAST:  24mL OMNIPAQUE IOHEXOL 300 MG/ML  SOLN COMPARISON:  None. FINDINGS: Lower chest: Unremarkable. Hepatobiliary: No suspicious focal abnormality within the liver parenchyma. There is no evidence for gallstones, gallbladder wall thickening, or pericholecystic fluid. No intrahepatic or extrahepatic biliary dilation. Pancreas: No focal mass lesion. No dilatation of the main duct. No intraparenchymal cyst. No peripancreatic edema. Spleen: No splenomegaly. No focal mass lesion. Adrenals/Urinary Tract: No adrenal nodule or mass. Kidneys unremarkable. No evidence for hydroureter. The urinary bladder appears normal for the degree of distention. Stomach/Bowel: Stomach is unremarkable. No gastric wall thickening. No evidence of outlet obstruction. Duodenum is normally positioned as is the ligament of Treitz. No small bowel wall thickening. No small bowel dilatation. The terminal ileum is normal. The appendix is diffusely distended and fluid-filled measuring up to about 8 mm diameter. 8 x 5 x 6 mm appendicoliths is noted at the base of the appendix. There also appears to be a tiny 3 x 3 x 7 mm stone at the tip of the appendix. 2.1 cm fluid collection is identified adjacent to the appendix in the right adnexal space. Coronal imaging (105/6) is more suggestive of focal periappendiceal fluid than right ovarian follicle/cyst. No gross colonic mass. No colonic wall thickening. Vascular/Lymphatic: No abdominal aortic aneurysm. No abdominal aortic atherosclerotic calcification. There is no gastrohepatic or hepatoduodenal ligament lymphadenopathy. No retroperitoneal or mesenteric lymphadenopathy. No pelvic sidewall lymphadenopathy. Reproductive: The uterus is unremarkable.  There is no adnexal mass. Other: Trace  free fluid noted in the cul-de-sac. Musculoskeletal: No worrisome lytic or sclerotic osseous abnormality. IMPRESSION: 1. Diffusely distended and fluid-filled appendix measuring up to about 8 mm diameter. 8 x 5 x 6 mm appendicoliths at the base of the appendix with a 3 x 3 x 7 mm stone at the tip of the appendix. Imaging features compatible with acute appendicitis. 2. 2.1 cm fluid collection raising the question of appendiceal perforation is noted adjacent to the appendix in the right adnexal space. Coronal imaging is more suggestive of focal periappendiceal fluid than right ovarian follicle/cyst although the latter is a possibility. 3. Trace free fluid in the cul-de-sac. I personally called the results of this study to the patient's provider, Domenic Moras, at the time of study interpretation. Electronically Signed   By: Misty Stanley M.D.   On: 12/18/2019 08:11      Assessment/Plan Depression  Hx of heart murmur  Acute appendicitis  - CT today shows dilated appendix with appendicoliths and 2.1 fluid collection concerning for possible perforation vs ovarian cyst  - WBC 26  - exam and history consistent with acute appendicitis - to OR later today for laparoscopic appendectomy provided COVID test negative - admit to observation  - discuss risks/benefits of surgery including bleeding, infection, injury to other structures, and possible need for conversion to open procedure. Patient verbalized understanding and is agreeable to proceed  FEN: NPO, IVF VTE: SCDs ID: [email or web address] ordered   Norm Parcel, [facility] 12/18/2019, 9:18 AM Please see Amion for pager number during day hours 7:00am-4:30pm

## 2019-12-18 NOTE — Plan of Care (Signed)
  Problem: Education: Goal: Knowledge of General Education information will improve Description: Including pain rating scale, medication(s)/side effects and non-pharmacologic comfort measures Outcome: Progressing   Problem: Health Behavior/Discharge Planning: Goal: Ability to manage health-related needs will improve Outcome: Progressing   Problem: Clinical Measurements: Goal: Ability to maintain clinical measurements within normal limits will improve Outcome: Progressing Goal: Will remain free from infection Outcome: Progressing Goal: Diagnostic test results will improve Outcome: Progressing Goal: Respiratory complications will improve Outcome: Progressing Goal: Cardiovascular complication will be avoided Outcome: Progressing   Problem: Activity: Goal: Risk for activity intolerance will decrease Outcome: Progressing   Problem: Nutrition: Goal: Adequate nutrition will be maintained Outcome: Progressing   Problem: Coping: Goal: Level of anxiety will decrease Outcome: Progressing   Problem: Elimination: Goal: Will not experience complications related to bowel motility Outcome: Progressing Goal: Will not experience complications related to urinary retention Outcome: Progressing   Problem: Pain Managment: Goal: General experience of comfort will improve Outcome: Progressing   Problem: Safety: Goal: Ability to remain free from injury will improve Outcome: Progressing   Problem: Skin Integrity: Goal: Risk for impaired skin integrity will decrease Outcome: Progressing   Problem: Education: Goal: Verbalization of understanding the information provided will improve Outcome: Progressing   Problem: Bowel/Gastric: Goal: Gastrointestinal status for postoperative course will improve Outcome: Progressing   Problem: Physical Regulation: Goal: Postoperative complications will be avoided or minimized Outcome: Progressing   Problem: Respiratory: Goal: Respiratory status  will improve Outcome: Progressing   Problem: Skin Integrity: Goal: Demonstration of wound healing without infection will improve Outcome: Progressing

## 2019-12-18 NOTE — Op Note (Signed)
BUNA CUPPETT [id number] November 21, 1975 12/18/2019  Appendectomy, Lap, laparoscopic right lateral abdominal wall tap block, Procedure Note  Indications: The patient presented with a history of right-sided abdominal pain. A CT revealed findings consistent with acute appendicitis with perforation and small fluid collection.  Please see chart for additional details  Pre-operative Diagnosis: Perforated appendicitis  Post-operative Diagnosis: Perforated necrotic appendicitis with purulent peritonitis  Surgeon: Greer Pickerel MD FACS  Assistants: None  Anesthesia: General endotracheal anesthesia  Procedure Details  The patient was seen again in the Holding Room. The risks, benefits, complications, treatment options, and expected outcomes were discussed with the patient and/or family. The possibilities of perforation of viscus, bleeding, recurrent infection, the need for additional procedures, failure to diagnose a condition, and creating a complication requiring transfusion or operation were discussed. There was concurrence with the proposed plan and informed consent was obtained. The site of surgery was properly noted. The patient was taken to Operating Room, identified as MADDYSON KEIL and the procedure verified as Appendectomy. A Time Out was held and the above information confirmed.  The patient was placed in the supine position and general anesthesia was induced, along with placement of orogastric tube, SCDs, and a Foley catheter.  A Foley was placed because the patient appeared to have perforated appendicitis with a fluid collection in her pelvis around her right tube and ovary so therefore I wanted the bladder decompressed during surgery and efforts to a visualization in case we came across dense adhesions. the abdomen was prepped and draped in a sterile fashion. A 1.5 centimeter infraumbilical incision was made.  The umbilical stalk was elevated, and the midline fascia was incised with a #11  blade.  A Kelly clamp was used to confirm entrance into the peritoneal cavity.  A pursestring suture was passed around the incision with a 0 Vicryl.  A 37mm Hasson was introduced into the abdomen and the tails of the suture were used to hold the Hasson in place.  Patient had very attenuated fascia.  She had a diastases.  The pneumoperitoneum was then established to steady pressure of 15 mmHg.  Additional 5 mm cannulas then placed in the left lower quadrant of the abdomen and the suprapubic region under direct visualization. A careful evaluation of the entire abdomen was carried out. The patient was placed in Trendelenburg and left lateral decubitus position. The small intestines were retracted in the cephalad and left lateral direction away from the pelvis and right lower quadrant. The patient was found to have an inflamed necrotic perforated appendix that was extending into the pelvis. There was  evidence of perforation.  There was pus above the uterus and in the pelvis.  The right tube and ovary were secondarily inflamed and adhered to the mesoappendix   The appendix was divided at its base using an endo-GIA stapler with a white load. No appendiceal stump was left in place. The appendix was carefully dissected. The appendix was was skeletonized with the harmonic scalpel.   The appendix was removed from the abdomen with an Ecco bag through the umbilical port.  There was no evidence of bleeding, leakage, or complication after division of the appendix. 4L Irrigation was also performed and irrigate suctioned from the abdomen as well.  I performed a right lateral abdominal wall tap block using laparoscopic guidance.  The umbilical port site was closed with the purse string suture.  I did place an additional 2 interrupted 0 Vicryl sutures using the PMI suture passer with laparoscopic guidance.  The closure was viewed laparoscopically. There was no residual palpable fascial defect.  The trocar site skin wounds were  closed with 4-0 Monocryl.  Steri-Strips gauze and Tegaderm were applied to the skin incisions.  Instrument, sponge, and needle counts were correct at the conclusion of the case.   Findings: The appendix was found to be inflamed. There were signs of necrosis.  There was perforation. There was not abscess formation.  Estimated Blood Loss:  Minimal         Drains: none         Specimens: appendix         Complications:  None; patient tolerated the procedure well.         Disposition: PACU - hemodynamically stable.         Condition: stable  Leighton Ruff. Redmond Pulling, MD, FACS General, Bariatric, & Minimally Invasive Surgery [facility], [place]

## 2019-12-18 NOTE — ED Provider Notes (Signed)
Received signout at the beginning of shift, please see previous providers note for complete H&P.  This is a 44 year old female presenting with lower abdominal pain since yesterday.  Labs remarkable for an elevated white count of 26.2.  A subsequent abdominal pelvis CT scan obtained showing diffuse distended and fluid-filled appendix with an appendicolith at the base of the appendix with findings concerning for acute appendicitis.  There is a 2.1 cm fluid collection raising the question of appendiceal perforation.  I have discussed finding with general surgery who agrees to see and admit patient.  Will provide additional pain medication per patient request.  Patient is made aware of plan.  BP 112/84   Pulse 99   Temp 97.9 F (36.6 C) (Oral)   Resp 20   LMP 12/04/2019   SpO2 96%   Results for orders placed or performed during the hospital encounter of 12/18/19  Lipase, blood  Result Value Ref Range   Lipase 27 11 - 51 U/L  Comprehensive metabolic panel  Result Value Ref Range   Sodium 134 (L) 135 - 145 mmol/L   Potassium 4.0 3.5 - 5.1 mmol/L   Chloride 101 98 - 111 mmol/L   CO2 23 22 - 32 mmol/L   Glucose, Bld 130 (H) 70 - 99 mg/dL   BUN 6 6 - 20 mg/dL   Creatinine, Ser 0.87 0.44 - 1.00 mg/dL   Calcium 8.8 (L) 8.9 - 10.3 mg/dL   Total Protein 7.0 6.5 - 8.1 g/dL   Albumin 3.5 3.5 - 5.0 g/dL   AST 16 15 - 41 U/L   ALT 12 0 - 44 U/L   Alkaline Phosphatase 62 38 - 126 U/L   Total Bilirubin 0.6 0.3 - 1.2 mg/dL   GFR, Estimated >60 >60 mL/min   Anion gap 10 5 - 15  CBC  Result Value Ref Range   WBC 26.2 (H) 4.0 - 10.5 K/uL   RBC 4.58 3.87 - 5.11 MIL/uL   Hemoglobin 11.7 (L) 12.0 - 15.0 g/dL   HCT 38.0 36 - 46 %   MCV 83.0 80.0 - 100.0 fL   MCH 25.5 (L) 26.0 - 34.0 pg   MCHC 30.8 30.0 - 36.0 g/dL   RDW 13.5 11.5 - 15.5 %   Platelets 322 150 - 400 K/uL   nRBC 0.0 0.0 - 0.2 %  Urinalysis, Routine w reflex microscopic Urine, Clean Catch  Result Value Ref Range   Color, Urine YELLOW  YELLOW   APPearance HAZY (A) CLEAR   Specific Gravity, Urine 1.017 1.005 - 1.030   pH 5.0 5.0 - 8.0   Glucose, UA NEGATIVE NEGATIVE mg/dL   Hgb urine dipstick NEGATIVE NEGATIVE   Bilirubin Urine NEGATIVE NEGATIVE   Ketones, ur 20 (A) NEGATIVE mg/dL   Protein, ur NEGATIVE NEGATIVE mg/dL   Nitrite NEGATIVE NEGATIVE   Leukocytes,Ua NEGATIVE NEGATIVE  I-Stat beta hCG blood, ED  Result Value Ref Range   I-stat hCG, quantitative <5.0 <5 mIU/mL   Comment 3           CT Abdomen Pelvis W Contrast  Result Date: 12/18/2019 CLINICAL DATA:  Lower abdominal pain with leukocytosis. EXAM: CT ABDOMEN AND PELVIS WITH CONTRAST TECHNIQUE: Multidetector CT imaging of the abdomen and pelvis was performed using the standard protocol following bolus administration of intravenous contrast. CONTRAST:  34mL OMNIPAQUE IOHEXOL 300 MG/ML  SOLN COMPARISON:  None. FINDINGS: Lower chest: Unremarkable. Hepatobiliary: No suspicious focal abnormality within the liver parenchyma. There is no evidence for gallstones,  gallbladder wall thickening, or pericholecystic fluid. No intrahepatic or extrahepatic biliary dilation. Pancreas: No focal mass lesion. No dilatation of the main duct. No intraparenchymal cyst. No peripancreatic edema. Spleen: No splenomegaly. No focal mass lesion. Adrenals/Urinary Tract: No adrenal nodule or mass. Kidneys unremarkable. No evidence for hydroureter. The urinary bladder appears normal for the degree of distention. Stomach/Bowel: Stomach is unremarkable. No gastric wall thickening. No evidence of outlet obstruction. Duodenum is normally positioned as is the ligament of Treitz. No small bowel wall thickening. No small bowel dilatation. The terminal ileum is normal. The appendix is diffusely distended and fluid-filled measuring up to about 8 mm diameter. 8 x 5 x 6 mm appendicoliths is noted at the base of the appendix. There also appears to be a tiny 3 x 3 x 7 mm stone at the tip of the appendix. 2.1 cm fluid  collection is identified adjacent to the appendix in the right adnexal space. Coronal imaging (105/6) is more suggestive of focal periappendiceal fluid than right ovarian follicle/cyst. No gross colonic mass. No colonic wall thickening. Vascular/Lymphatic: No abdominal aortic aneurysm. No abdominal aortic atherosclerotic calcification. There is no gastrohepatic or hepatoduodenal ligament lymphadenopathy. No retroperitoneal or mesenteric lymphadenopathy. No pelvic sidewall lymphadenopathy. Reproductive: The uterus is unremarkable.  There is no adnexal mass. Other: Trace free fluid noted in the cul-de-sac. Musculoskeletal: No worrisome lytic or sclerotic osseous abnormality. IMPRESSION: 1. Diffusely distended and fluid-filled appendix measuring up to about 8 mm diameter. 8 x 5 x 6 mm appendicoliths at the base of the appendix with a 3 x 3 x 7 mm stone at the tip of the appendix. Imaging features compatible with acute appendicitis. 2. 2.1 cm fluid collection raising the question of appendiceal perforation is noted adjacent to the appendix in the right adnexal space. Coronal imaging is more suggestive of focal periappendiceal fluid than right ovarian follicle/cyst although the latter is a possibility. 3. Trace free fluid in the cul-de-sac. I personally called the results of this study to the patient's provider, Domenic Moras, at the time of study interpretation. Electronically Signed   By: Misty Stanley M.D.   On: 12/18/2019 08:11      Domenic Moras, PA-C 12/18/19 6503    Merryl Hacker, MD 12/21/19 [phone]

## 2019-12-18 NOTE — Transfer of Care (Signed)
Immediate Anesthesia Transfer of Care Note  Patient: Elaine Casey  Procedure(s) Performed: APPENDECTOMY LAPAROSCOPIC (N/A Abdomen)  Patient Location: PACU  Anesthesia Type:General  Level of Consciousness: awake, alert , oriented and patient cooperative  Airway & Oxygen Therapy: Patient Spontanous Breathing and Patient connected to face mask oxygen  Post-op Assessment: Report given to RN and Post -op Vital signs reviewed and stable  Post vital signs: Reviewed and stable  Last Vitals:  Vitals Value Taken Time  BP 115/65 12/18/19 1417  Temp    Pulse 104 12/18/19 1420  Resp 25 12/18/19 1420  SpO2 100 % 12/18/19 1420  Vitals shown include unvalidated device data.  Last Pain:  Vitals:   12/18/19 1051  TempSrc:   PainSc: 6          Complications: No complications documented.

## 2019-12-18 NOTE — ED Notes (Signed)
Pt brought back into triage for re-eval. Pain in lower abd 7/10. Pt states intermittently shooting pain. Ambulatory. Appears to not feel well but in no acute distress. Medicated per protocol.

## 2019-12-18 NOTE — ED Provider Notes (Signed)
[facility] EMERGENCY DEPARTMENT Provider Note   CSN: [id number] Arrival date & time: 12/18/19  0123     History Chief Complaint  Patient presents with  . Abdominal Pain    Elaine Casey is a 44 y.o. female with a history of depression and sleep apnea who presents to the emergency department with complaints of lower abdominal pain that began 12/16/19.  Patient states pain began in the lower abdomen was initially more mild, and then last night around 9 PM seem to get more severe.  Patient states the pain is located to the bilateral lower abdomen, it feels crampy/aching in nature, she did have 1 episode of sharp pain to the right side, no specific alleviating or aggravating factors until ED arrival when she was given Percocet this morning which seems to have helped her discomfort.  Current pain is mild, 3 out of 10 in severity.  Has had associated nausea with dry heaves.  Her last bowel movement was about 12 hours ago and was small but nonbloody and non diarrheal.  She did have a fever prior to ER arrival at home with a temp of 101.8.  She denies emesis, hematemesis, melena, hematochezia, diarrhea, dysuria, frequency, urgency, vaginal bleeding, vaginal discharge, chest pain, or dyspnea.  Her last menstrual period was 2 weeks prior.  She is sexually active in a monogamous relationship and has no concern for STDs.  She denies prior abdominal surgeries. HPI     Past Medical History:  Diagnosis Date  . Allergy   . Depression   . Heart murmur     Patient Active Problem List   Diagnosis Date Noted  . OSA (obstructive sleep apnea) 09/02/2019    No past surgical history on file.   OB History   No obstetric history on file.     Family History  Problem Relation Age of Onset  . Diabetes Mother   . High Cholesterol Mother   . High blood pressure Mother   . COPD Father   . Depression Father   . Learning disabilities Father   . Healthy Daughter   . Healthy Daughter    . Cancer Maternal Grandmother   . Diabetes Maternal Grandmother   . Heart disease Maternal Grandmother   . High Cholesterol Maternal Grandmother   . Stroke Maternal Grandmother   . High blood pressure Maternal Grandmother     Social History   Tobacco Use  . Smoking status: Never Smoker  . Smokeless tobacco: Never Used  Substance Use Topics  . Alcohol use: Never  . Drug use: Never    Home Medications Prior to Admission medications   Medication Sig Start Date End Date Taking? Authorizing Provider  buPROPion (WELLBUTRIN XL) 300 MG 24 hr tablet Take 300 mg by mouth daily.    [provider]  cholecalciferol (VITAMIN D3) 25 MCG (1000 UNIT) tablet Take 1,000 Units by mouth daily.    [provider]  escitalopram (LEXAPRO) 20 MG tablet Take 20 mg by mouth daily.    [provider]  ferrous sulfate 325 (65 FE) MG tablet Take 325 mg by mouth daily with breakfast.    [provider]  lamoTRIgine (LAMICTAL) 150 MG tablet Take 150 mg by mouth daily.    [provider]  norgestimate-ethinyl estradiol (ORTHO-CYCLEN) 0.25-35 MG-MCG tablet Take 1 tablet by mouth daily. 04/02/19   Leamon Arnt, MD    Allergies    Patient has no known allergies.  Review of Systems  Review of Systems  Constitutional: Positive for fever.  Respiratory: Negative for shortness of breath.   Cardiovascular: Negative for chest pain.  Gastrointestinal: Positive for abdominal pain and nausea. Negative for anal bleeding, blood in stool, diarrhea and vomiting.  Genitourinary: Negative for dysuria, vaginal bleeding and vaginal discharge.  Neurological: Negative for syncope.  All other systems reviewed and are negative.   Physical Exam Updated Vital Signs BP 114/76   Pulse (!) 104   Temp 97.9 F (36.6 C) (Oral)   Resp 20   SpO2 96%   Physical Exam Vitals and nursing note reviewed.  Constitutional:      General: She is not in acute distress.    Appearance: She  is well-developed. She is not toxic-appearing.  HENT:     Head: Normocephalic and atraumatic.  Eyes:     General:        Right eye: No discharge.        Left eye: No discharge.     Conjunctiva/sclera: Conjunctivae normal.  Cardiovascular:     Rate and Rhythm: Regular rhythm. Tachycardia present.  Pulmonary:     Effort: Pulmonary effort is normal. No respiratory distress.     Breath sounds: Normal breath sounds. No wheezing, rhonchi or rales.  Abdominal:     General: There is no distension.     Palpations: Abdomen is soft.     Tenderness: There is abdominal tenderness in the right lower quadrant and left lower quadrant. There is no guarding or rebound.  Genitourinary:    Comments: Deferred.  Musculoskeletal:     Cervical back: Neck supple.  Skin:    General: Skin is warm and dry.     Findings: No rash.  Neurological:     Mental Status: She is alert.     Comments: Clear speech.   Psychiatric:        Behavior: Behavior normal.    ED Results / Procedures / Treatments   Labs (all labs ordered are listed, but only abnormal results are displayed) Labs Reviewed  COMPREHENSIVE METABOLIC PANEL - Abnormal; Notable for the following components:      Result Value   Sodium 134 (*)    Glucose, Bld 130 (*)    Calcium 8.8 (*)    All other components within normal limits  CBC - Abnormal; Notable for the following components:   WBC 26.2 (*)    Hemoglobin 11.7 (*)    MCH 25.5 (*)    All other components within normal limits  URINALYSIS, ROUTINE W REFLEX MICROSCOPIC - Abnormal; Notable for the following components:   APPearance HAZY (*)    Ketones, ur 20 (*)    All other components within normal limits  LIPASE, BLOOD  I-STAT BETA HCG BLOOD, ED ([facility], [facility], [facility] ONLY)    EKG None  Radiology No results found.  Procedures Procedures (including critical care time)  Medications Ordered in ED Medications  oxyCODONE-acetaminophen (PERCOCET/ROXICET) 5-325 MG per tablet 1 tablet (1  tablet Oral Given 12/18/19 0346)  sodium chloride 0.9 % bolus 1,000 mL (has no administration in time range)  ondansetron (ZOFRAN) injection 4 mg (has no administration in time range)  fentaNYL (SUBLIMAZE) injection 50 mcg (has no administration in time range)    ED Course  I have reviewed the triage vital signs and the nursing notes.  Pertinent labs & imaging results that were available during my care of the patient were reviewed by me and considered in my medical decision making (see chart for details).  MDM Rules/Calculators/A&P                         Patient presents to the ED with complaints of abdominal pain.  Patient is nontoxic, vitals with mild tachycardia, otherwise unremarkable.    Additional history obtained:  Additional history obtained from chart review & nursing note review.   Lab Tests:  I reviewed and interpreted labs, which included:  CBC: Significant leukocytosis at 26.2.  Mild anemia similar to prior. CMP: Mild hypocalcemia and hyponatremia, no significant electrolyte derangement.  Renal function and LFTs are within normal limits. Lipase: Within normal limits Pregnancy test: Negative Urinalysis: No UTI  On exam she has lower abdominal tenderness, no significant suprapubic tenderness.  No peritoneal signs.  We discussed the option of a pelvic exam, ultimately patient is in a monogamous relationship, she has no concern for STDs, therefore this was deferred per shared decision-making conversation with the patient-PID felt to be unlikely.  We will plan for CT abdomen pelvis with contrast-primarily considering appendicitis, diverticulitis, colitis, viral GI illness, perforation, obstruction, ovarian cyst in terms of differential.  She states she does not need any current pain medication, PRN analgesics and antiemetics ordered as well as fluids.   Patient care signed out to Domenic Moras, PA-C at change of shift pending CT imaging and disposition.  Portions of this note  were generated with [employer]. Dictation errors may occur despite best attempts at proofreading.  Final Clinical Impression(s) / ED Diagnoses Final diagnoses:  None    Rx / DC Orders ED Discharge Orders    None       Amaryllis Dyke, PA-C 12/18/19 0636    Merryl Hacker, MD 12/21/19 [phone]

## 2019-12-18 NOTE — Discharge Instructions (Signed)
[facility] LAPAROSCOPIC SURGERY: POST OP INSTRUCTIONS Always review your discharge instruction sheet given to you by the facility where your surgery was performed. IF YOU HAVE DISABILITY OR FAMILY LEAVE FORMS, YOU MUST BRING THEM TO THE OFFICE FOR PROCESSING.   DO NOT GIVE THEM TO YOUR DOCTOR.  PAIN CONTROL  1. First take acetaminophen (Tylenol) AND/or ibuprofen (Advil) to control your pain after surgery.  Follow directions on package.  Taking acetaminophen (Tylenol) and/or ibuprofen (Advil) regularly after surgery will help to control your pain and lower the amount of prescription pain medication you may need.  You should not take more than 3,000 mg (3 grams) of acetaminophen (Tylenol) in 24 hours.  You should not take ibuprofen (Advil), aleve, motrin, naprosyn or other NSAIDS if you have a history of stomach ulcers or chronic kidney disease.  2. A prescription for pain medication may be given to you upon discharge.  Take your pain medication as prescribed, if you still have uncontrolled pain after taking acetaminophen (Tylenol) or ibuprofen (Advil). 3. Use ice packs to help control pain. 4. If you need a refill on your pain medication, please contact your pharmacy.  They will contact our office to request authorization. Prescriptions will not be filled after 5pm or on week-ends.  HOME MEDICATIONS 5. Take your usually prescribed medications unless otherwise directed.  DIET 6. You should follow a light diet the first few days after arrival home.  Be sure to include lots of fluids daily. Avoid fatty, fried foods.   CONSTIPATION 7. It is common to experience some constipation after surgery and if you are taking pain medication.  Increasing fluid intake and taking a stool softener (such as Colace) will usually help or prevent this problem from occurring.  A mild laxative (Milk of Magnesia or Miralax) should be taken according to package instructions if there are no bowel  movements after 48 hours.  WOUND/INCISION CARE 8. Most patients will experience some swelling and bruising in the area of the incisions.  Ice packs will help.  Swelling and bruising can take several days to resolve.  9. Unless discharge instructions indicate otherwise, follow guidelines below  a. STERI-STRIPS - you may remove your outer bandages 48 hours after surgery, and you may shower at that time.  You have steri-strips (small skin tapes) in place directly over the incision.  These strips should be left on the skin for 7-10 days.   b. DERMABOND/SKIN GLUE - you may shower in 24 hours.  The glue will flake off over the next 2-3 weeks. 10. Any sutures or staples will be removed at the office during your follow-up visit.  ACTIVITIES 11. You may resume regular (light) daily activities beginning the next day--such as daily self-care, walking, climbing stairs--gradually increasing activities as tolerated.  You may have sexual intercourse when it is comfortable.  Refrain from any heavy lifting or straining until approved by your doctor. a. You may drive when you are no longer taking prescription pain medication, you can comfortably wear a seatbelt, and you can safely maneuver your car and apply brakes.  FOLLOW-UP 12. You should see your doctor in the office for a follow-up appointment approximately 2-3 weeks after your surgery.  You should have been given your post-op/follow-up appointment when your surgery was scheduled.  If you did not receive a post-op/follow-up appointment, make sure that you call for this appointment within a day or two after you arrive home to insure a convenient appointment time.     WHEN TO CALL YOUR DOCTOR: 1. Fever over 101.0 2. Inability to urinate 3. Continued bleeding from incision. 4. Increased pain, redness, or drainage from the incision. 5. Increasing abdominal pain  The clinic staff is available to answer your questions during regular business hours.  Please don't  hesitate to call and ask to speak to one of the nurses for clinical concerns.  If you have a medical emergency, go to the nearest emergency room or call 911.  A surgeon from [facility] is always on call at the hospital. [address], [address], [place], [place]  [address] ? [address] [address], [place], [place]   [address] [phone] ? [phone] ? FAX [phone] Web site: [email or web address]  .........   Managing Your Pain After Surgery Without Opioids    Thank you for participating in our program to help patients manage their pain after surgery without opioids. This is part of our effort to provide you with the best care possible, without exposing you or your family to the risk that opioids pose.  What pain can I expect after surgery? You can expect to have some pain after surgery. This is normal. The pain is typically worse the day after surgery, and quickly begins to get better. Many studies have found that many patients are able to manage their pain after surgery with Over-the-Counter (OTC) medications such as Tylenol and Motrin. If you have a condition that does not allow you to take Tylenol or Motrin, notify your surgical team.  How will I manage my pain? The best strategy for controlling your pain after surgery is around the clock pain control with Tylenol (acetaminophen) and Motrin (ibuprofen or Advil). Alternating these medications with each other allows you to maximize your pain control. In addition to Tylenol and Motrin, you can use heating pads or ice packs on your incisions to help reduce your pain.  How will I alternate your regular strength over-the-counter pain medication? You will take a dose of pain medication every three hours. ; Start by taking 650 mg of Tylenol (2 pills of 325 mg) ; 3 hours later take 600 mg of Motrin (3 pills of 200 mg) ; 3 hours after taking the Motrin take 650 mg of Tylenol ; 3 hours after that take 600 mg of  Motrin.   - 1 -  See example - if your first dose of Tylenol is at 12:00 PM   12:00 PM Tylenol 650 mg (2 pills of 325 mg)  3:00 PM Motrin 600 mg (3 pills of 200 mg)  6:00 PM Tylenol 650 mg (2 pills of 325 mg)  9:00 PM Motrin 600 mg (3 pills of 200 mg)  Continue alternating every 3 hours   We recommend that you follow this schedule around-the-clock for at least 3 days after surgery, or until you feel that it is no longer needed. Use the table on the last page of this handout to keep track of the medications you are taking. Important: Do not take more than 3000mg of Tylenol or 3200mg of Motrin in a 24-hour period. Do not take ibuprofen/Motrin if you have a history of bleeding stomach ulcers, severe kidney disease, &/or actively taking a blood thinner  What if I still have pain? If you have pain that is not controlled with the over-the-counter pain medications (Tylenol and Motrin or Advil) you might have what we call "breakthrough" pain. You will receive a prescription for a small amount of an opioid pain medication such as   Oxycodone, Tramadol, or Tylenol with Codeine. Use these opioid pills in the first 24 hours after surgery if you have breakthrough pain. Do not take more than 1 pill every 4-6 hours.  If you still have uncontrolled pain after using all opioid pills, don't hesitate to call our staff using the number provided. We will help make sure you are managing your pain in the best way possible, and if necessary, we can provide a prescription for additional pain medication.   Day 1    Time  Name of Medication Number of pills taken  Amount of Acetaminophen  Pain Level   Comments  AM PM       AM PM       AM PM       AM PM       AM PM       AM PM       AM PM       AM PM       Total Daily amount of Acetaminophen Do not take more than  3,000 mg per day      Day 2    Time  Name of Medication Number of pills taken  Amount of Acetaminophen  Pain Level   Comments  AM  PM       AM PM       AM PM       AM PM       AM PM       AM PM       AM PM       AM PM       Total Daily amount of Acetaminophen Do not take more than  3,000 mg per day      Day 3    Time  Name of Medication Number of pills taken  Amount of Acetaminophen  Pain Level   Comments  AM PM       AM PM       AM PM       AM PM          AM PM       AM PM       AM PM       AM PM       Total Daily amount of Acetaminophen Do not take more than  3,000 mg per day      Day 4    Time  Name of Medication Number of pills taken  Amount of Acetaminophen  Pain Level   Comments  AM PM       AM PM       AM PM       AM PM       AM PM       AM PM       AM PM       AM PM       Total Daily amount of Acetaminophen Do not take more than  3,000 mg per day      Day 5    Time  Name of Medication Number of pills taken  Amount of Acetaminophen  Pain Level   Comments  AM PM       AM PM       AM PM       AM PM       AM PM       AM PM       AM PM         AM PM       Total Daily amount of Acetaminophen Do not take more than  3,000 mg per day       Day 6    Time  Name of Medication Number of pills taken  Amount of Acetaminophen  Pain Level  Comments  AM PM       AM PM       AM PM       AM PM       AM PM       AM PM       AM PM       AM PM       Total Daily amount of Acetaminophen Do not take more than  3,000 mg per day      Day 7    Time  Name of Medication Number of pills taken  Amount of Acetaminophen  Pain Level   Comments  AM PM       AM PM       AM PM       AM PM       AM PM       AM PM       AM PM       AM PM       Total Daily amount of Acetaminophen Do not take more than  3,000 mg per day        For additional information about how and where to safely dispose of unused opioid medications - https://www.morepowerfulnc.org  Disclaimer: This document contains information and/or instructional materials adapted from [facility]  for the typical patient with your condition. It does not replace medical advice from your health care provider because your experience may differ from that of the typical patient. Talk to your health care provider if you have any questions about this document, your condition or your treatment plan. Adapted from [facility]  

## 2019-12-18 NOTE — ED Triage Notes (Signed)
Pt presents to ED POV. Pt c/o lower abd pain, nausea. Pain is cramping 7/10. Pt denies any other GI/GU complaints. Pt states her temp at home was 101.8.

## 2019-12-18 NOTE — ED Notes (Signed)
Pt ambulatory to restroom

## 2019-12-19 ENCOUNTER — Encounter (HOSPITAL_COMMUNITY): Payer: Self-pay | Admitting: General Surgery

## 2019-12-19 LAB — BASIC METABOLIC PANEL
Anion gap: 10 (ref 5–15)
BUN: 6 mg/dL (ref 6–20)
CO2: 20 mmol/L — ABNORMAL LOW (ref 22–32)
Calcium: 7.7 mg/dL — ABNORMAL LOW (ref 8.9–10.3)
Chloride: 107 mmol/L (ref 98–111)
Creatinine, Ser: 0.71 mg/dL (ref 0.44–1.00)
GFR, Estimated: >60 mL/min (ref >60)
Glucose, Bld: 137 mg/dL — ABNORMAL HIGH (ref 70–99)
Potassium: 3.8 mmol/L (ref 3.5–5.1)
Sodium: 137 mmol/L (ref 135–145)

## 2019-12-19 LAB — CBC
HCT: 32.8 % — ABNORMAL LOW (ref 36.0–46.0)
Hemoglobin: 9.9 g/dL — ABNORMAL LOW (ref 12.0–15.0)
MCH: 25 pg — ABNORMAL LOW (ref 26.0–34.0)
MCHC: 30.2 g/dL (ref 30.0–36.0)
MCV: 82.8 fL (ref 80.0–100.0)
Platelets: 239 10*3/uL (ref 150–400)
RBC: 3.96 MIL/uL (ref 3.87–5.11)
RDW: 13.7 % (ref 11.5–15.5)
WBC: 19.1 10*3/uL — ABNORMAL HIGH (ref 4.0–10.5)
nRBC: 0 % (ref 0.0–0.2)

## 2019-12-19 LAB — SURGICAL PATHOLOGY

## 2019-12-19 MED ORDER — AMOXICILLIN-POT CLAVULANATE 875-125 MG PO TABS: 1.0000 | ORAL_TABLET | Freq: Two times a day (BID) | ORAL | 0 refills | Status: AC

## 2019-12-19 MED ORDER — OXYCODONE HCL 5 MG PO TABS
5.0000 mg | ORAL_TABLET | Freq: Four times a day (QID) | ORAL | 0 refills | Status: DC | PRN
Start: 2019-12-19 — End: 2020-01-13

## 2019-12-19 MED ORDER — ACETAMINOPHEN 500 MG PO TABS: 1000.0000 mg | ORAL_TABLET | Freq: Three times a day (TID) | ORAL | Status: DC | PRN

## 2019-12-19 NOTE — Discharge Summary (Signed)
[facility] Discharge Summary   Patient ID: Elaine Casey MRN: [id number] DOB/AGE: Dec 21, 1975 44 y.o.  Admit date: 12/18/2019 Discharge date: 12/19/2019  Admitting Diagnosis: Acute appendicitis  Discharge Diagnosis Patient Active Problem List   Diagnosis Date Noted  . Acute appendicitis 12/18/2019  . OSA (obstructive sleep apnea) 09/02/2019    Consultants None   Imaging: CT Abdomen Pelvis W Contrast  Result Date: 12/18/2019 CLINICAL DATA:  Lower abdominal pain with leukocytosis. EXAM: CT ABDOMEN AND PELVIS WITH CONTRAST TECHNIQUE: Multidetector CT imaging of the abdomen and pelvis was performed using the standard protocol following bolus administration of intravenous contrast. CONTRAST:  83mL OMNIPAQUE IOHEXOL 300 MG/ML  SOLN COMPARISON:  None. FINDINGS: Lower chest: Unremarkable. Hepatobiliary: No suspicious focal abnormality within the liver parenchyma. There is no evidence for gallstones, gallbladder wall thickening, or pericholecystic fluid. No intrahepatic or extrahepatic biliary dilation. Pancreas: No focal mass lesion. No dilatation of the main duct. No intraparenchymal cyst. No peripancreatic edema. Spleen: No splenomegaly. No focal mass lesion. Adrenals/Urinary Tract: No adrenal nodule or mass. Kidneys unremarkable. No evidence for hydroureter. The urinary bladder appears normal for the degree of distention. Stomach/Bowel: Stomach is unremarkable. No gastric wall thickening. No evidence of outlet obstruction. Duodenum is normally positioned as is the ligament of Treitz. No small bowel wall thickening. No small bowel dilatation. The terminal ileum is normal. The appendix is diffusely distended and fluid-filled measuring up to about 8 mm diameter. 8 x 5 x 6 mm appendicoliths is noted at the base of the appendix. There also appears to be a tiny 3 x 3 x 7 mm stone at the tip of the appendix. 2.1 cm fluid collection is identified adjacent to the appendix in the right  adnexal space. Coronal imaging (105/6) is more suggestive of focal periappendiceal fluid than right ovarian follicle/cyst. No gross colonic mass. No colonic wall thickening. Vascular/Lymphatic: No abdominal aortic aneurysm. No abdominal aortic atherosclerotic calcification. There is no gastrohepatic or hepatoduodenal ligament lymphadenopathy. No retroperitoneal or mesenteric lymphadenopathy. No pelvic sidewall lymphadenopathy. Reproductive: The uterus is unremarkable.  There is no adnexal mass. Other: Trace free fluid noted in the cul-de-sac. Musculoskeletal: No worrisome lytic or sclerotic osseous abnormality. IMPRESSION: 1. Diffusely distended and fluid-filled appendix measuring up to about 8 mm diameter. 8 x 5 x 6 mm appendicoliths at the base of the appendix with a 3 x 3 x 7 mm stone at the tip of the appendix. Imaging features compatible with acute appendicitis. 2. 2.1 cm fluid collection raising the question of appendiceal perforation is noted adjacent to the appendix in the right adnexal space. Coronal imaging is more suggestive of focal periappendiceal fluid than right ovarian follicle/cyst although the latter is a possibility. 3. Trace free fluid in the cul-de-sac. I personally called the results of this study to the patient's provider, Domenic Moras, at the time of study interpretation. Electronically Signed   By: Misty Stanley M.D.   On: 12/18/2019 08:11    Procedures Dr. Redmond Pulling (12/18/19) - Laparoscopic Appendectomy  Hospital Course:  Patient is a 44 year old female who presented to [facility] with abdominal pain.  Workup showed acute appendicitis.  Patient was admitted and underwent procedure listed above. Appendix noted to be perforated but without abscess intra-operatively.  Tolerated procedure well and was transferred to the floor.  Diet was advanced as tolerated.  On POD#1, the patient was voiding well, tolerating diet, ambulating well, pain well controlled, vital signs stable, incisions c/d/i and  felt stable for discharge home.  Patient will follow up in our office in 2 weeks and knows to call with questions or concerns.  She will call to confirm appointment date/time.    Physical Exam: General:  Alert, NAD, pleasant, comfortable Abd:  Soft, ND, mild tenderness, incisions C/D/I   I or a member of my team have reviewed this patient in the Controlled Substance Database.   Allergies as of 12/19/2019   No Known Allergies     Medication List    TAKE these medications   acetaminophen 500 MG tablet Commonly known as: TYLENOL Take 2 tablets (1,000 mg total) by mouth every 8 (eight) hours as needed for mild pain or fever.   amoxicillin-clavulanate 875-125 MG tablet Commonly known as: Augmentin Take 1 tablet by mouth every 12 (twelve) hours for 5 days.   buPROPion 150 MG 24 hr tablet Commonly known as: WELLBUTRIN XL Take 300 mg by mouth daily.   escitalopram 20 MG tablet Commonly known as: LEXAPRO Take 20 mg by mouth daily.   ferrous sulfate 325 (65 FE) MG tablet Take 325 mg by mouth daily with breakfast.   lamoTRIgine 150 MG tablet Commonly known as: LAMICTAL Take 150 mg by mouth daily.   norgestimate-ethinyl estradiol 0.25-35 MG-MCG tablet Commonly known as: ORTHO-CYCLEN Take 1 tablet by mouth daily.   oxyCODONE 5 MG immediate release tablet Commonly known as: Roxicodone Take 1 tablet (5 mg total) by mouth every 6 (six) hours as needed for moderate pain.         Follow-up Information    Surgery, [place]. Go on 01/02/2020.   Specialty: General Surgery Why: Follow up appointment scheduled for 1:45 PM. Please arrive 30 min prior to appointment time. Bring photo ID and insurance information.  Contact information: [address] [place] [address] [phone]               Signed: Norm Parcel , [facility] 12/19/2019, 9:08 AM Please see Amion for pager number during day hours 7:00am-4:30pm

## 2019-12-19 NOTE — Progress Notes (Signed)
Patient transported off floor via wheelchair with volunteer staff to private vehicle with mom for d/c to home in stable condition.  Upon discharge, patient denied need for pain medication, VSS, denied further needs. Education provided with verbalization of understanding.

## 2019-12-19 NOTE — Anesthesia Postprocedure Evaluation (Signed)
Anesthesia Post Note  Patient: Elaine Casey  Procedure(s) Performed: APPENDECTOMY LAPAROSCOPIC (N/A Abdomen)     Patient location during evaluation: Other Anesthesia Type: General Level of consciousness: awake and alert Pain management: pain level controlled Vital Signs Assessment: post-procedure vital signs reviewed and stable Respiratory status: spontaneous breathing, nonlabored ventilation and respiratory function stable Cardiovascular status: blood pressure returned to baseline and stable Postop Assessment: no apparent nausea or vomiting Anesthetic complications: no   No complications documented.  Last Vitals:  Vitals:   12/19/19 0522 12/19/19 0850  BP: 108/68 106/68  Pulse: 71 68  Resp: 18 16  Temp: (!) 36.3 C 36.7 C  SpO2:  97%    Last Pain:  Vitals:   12/19/19 1039  TempSrc:   PainSc: 3                  Mckenize Mezera,W. EDMOND

## 2020-01-13 ENCOUNTER — Other Ambulatory Visit: Payer: Self-pay

## 2020-01-13 ENCOUNTER — Ambulatory Visit: Payer: Federal, State, Local not specified - PPO | Admitting: Family Medicine

## 2020-01-13 ENCOUNTER — Encounter: Payer: Self-pay | Admitting: Family Medicine

## 2020-01-13 VITALS — BP 118/74 | HR 96 | Temp 97.6°F | Ht 65.0 in | Wt 202.6 lb

## 2020-01-13 DIAGNOSIS — B079 Viral wart, unspecified: Secondary | ICD-10-CM | POA: Diagnosis not present

## 2020-01-13 NOTE — Progress Notes (Signed)
Cryotherapy Procedure Note  Pre-operative Diagnosis: warts, painful  Post-operative Diagnosis: same  Locations: Left hand: Middle finger media:  43mm raised verrucous lesion; 4-5 mm warty lesion medial periungual region of thumb    Indications: pain  Anesthesia: none  Procedure Details   Patient informed of risks (permanent scarring, infection, light or dark discoloration, bleeding, infection, weakness, numbness and recurrence of the lesion) and benefits of the procedure and verbal informed consent obtained. Universal time out performed  The areas are treated with liquid nitrogen therapy, frozen until ice ball extended 2 mm beyond lesion, allowed to thaw, and treated again. The patient tolerated procedure well.  The patient was instructed on post-op care, warned that there may be blister formation, redness and pain. Recommend OTC analgesia as needed for pain.  Condition: Stable  Complications: none.

## 2020-01-29 ENCOUNTER — Encounter: Payer: Self-pay | Admitting: Family Medicine

## 2020-01-29 ENCOUNTER — Ambulatory Visit: Payer: Federal, State, Local not specified - PPO | Admitting: Family Medicine

## 2020-01-29 ENCOUNTER — Other Ambulatory Visit: Payer: Self-pay

## 2020-01-29 VITALS — BP 112/78 | HR 104 | Temp 98.5°F | Wt 203.0 lb

## 2020-01-29 DIAGNOSIS — B079 Viral wart, unspecified: Secondary | ICD-10-CM

## 2020-01-29 NOTE — Progress Notes (Signed)
Cryotherapy Procedure Note  Pre-operative Diagnosis: warts, painful  Post-operative Diagnosis: same  Locations: Left hand: Middle finger media:  91mm raised verrucous lesion; 4-5 mm warty lesion medial periungual region of thumb    Indications: pain  Anesthesia: none  Procedure Details   Patient informed of risks (permanent scarring, infection, light or dark discoloration, bleeding, infection, weakness, numbness and recurrence of the lesion) and benefits of the procedure and verbal informed consent obtained. Universal time out performed  The areas are treated with liquid nitrogen therapy, frozen until ice ball extended 2 mm beyond lesion, allowed to thaw, and treated again. The patient tolerated procedure well.  The patient was instructed on post-op care, warned that there may be blister formation, redness and pain. Recommend OTC analgesia as needed for pain.  Condition: Stable  Complications: none.

## 2020-02-19 ENCOUNTER — Other Ambulatory Visit: Payer: Self-pay

## 2020-02-19 ENCOUNTER — Encounter: Payer: Self-pay | Admitting: Family Medicine

## 2020-02-19 ENCOUNTER — Ambulatory Visit: Payer: Federal, State, Local not specified - PPO | Admitting: Family Medicine

## 2020-02-19 VITALS — BP 108/72 | HR 84 | Temp 97.8°F | Wt 204.4 lb

## 2020-02-19 DIAGNOSIS — B079 Viral wart, unspecified: Secondary | ICD-10-CM

## 2020-02-19 DIAGNOSIS — S93401D Sprain of unspecified ligament of right ankle, subsequent encounter: Secondary | ICD-10-CM

## 2020-02-19 NOTE — Progress Notes (Signed)
Subjective  CC:  Chief Complaint  Patient presents with  . verruca warts    Improvement over the past 3 weeks  . Ankle Injury    Sprained ankle dec 8th walking down steps    HPI: Elaine Casey is a 44 y.o. female who presents to the office today to address the problems listed above in the chief complaint.  Warts on hands: much improved. Almost gone. Has been using duct tape.   Dec 8th was walking down steps and missed the last step; inversion injury to right ankle. [place]. Sought out care at urgent care center and reports a neg ankle xray. She used crutches for a day because it was hard to weight bare. Still limping. Not wearing a splint. No meds.    Assessment  1. Moderate right ankle sprain, subsequent encounter   2. Verruca warts (infectious)      Plan   Ankle sprain:  Education given. See avs and gave exercise to start once pain free from sports med patient advisor. Start figure of 8 splint and wear for 2 weeks. Perform ROM exercise and may use nsaids as needed. Discussed formal rehab if needed.   Verruca warts: healing. Last cryotherapy done . No complications. This should resolve them.  Follow up:  prn   No orders of the defined types were placed in this encounter.  No orders of the defined types were placed in this encounter.     I reviewed the patients updated PMH, FH, and SocHx.    Patient Active Problem List   Diagnosis Date Noted  . Acute appendicitis 12/18/2019  . OSA (obstructive sleep apnea) 09/02/2019   Current Meds  Medication Sig  . acetaminophen (TYLENOL) 500 MG tablet Take 2 tablets (1,000 mg total) by mouth every 8 (eight) hours as needed for mild pain or fever.  Marland Kitchen buPROPion (WELLBUTRIN XL) 150 MG 24 hr tablet Take 300 mg by mouth daily.  Marland Kitchen escitalopram (LEXAPRO) 20 MG tablet Take 20 mg by mouth daily.  . ferrous sulfate 325 (65 FE) MG tablet Take 325 mg by mouth daily with breakfast.  . lamoTRIgine (LAMICTAL) 150 MG tablet Take 150 mg  by mouth daily.  . norgestimate-ethinyl estradiol (ORTHO-CYCLEN) 0.25-35 MG-MCG tablet Take 1 tablet by mouth daily.    Allergies: Patient has No Known Allergies. Family History: Patient family history includes COPD in her father; Cancer in her maternal grandmother; Depression in her father; Diabetes in her maternal grandmother and mother; Healthy in her daughter and daughter; Heart disease in her maternal grandmother; High Cholesterol in her maternal grandmother and mother; High blood pressure in her maternal grandmother and mother; Learning disabilities in her father; Stroke in her maternal grandmother. Social History:  Patient  reports that she has never smoked. She has never used smokeless tobacco. She reports that she does not drink alcohol and does not use drugs.  Review of Systems: Constitutional: Negative for fever malaise or anorexia Cardiovascular: negative for chest pain Respiratory: negative for SOB or persistent cough Gastrointestinal: negative for abdominal pain  Objective  Vitals: BP 108/72   Pulse 84   Temp 97.8 F (36.6 C) (Temporal)   Wt 204 lb 6.4 oz (92.7 kg)   SpO2 97%   BMI 34.01 kg/m  General: no acute distress , A&Ox3 Right ankle: lateral ttp, pain with full inversion. Full ROM and intact joint. Ant ecchymosis present. Limp present.  Hands: thumb and 3rd digit periungual warts much smaller.   Cryotherapy  Procedure Note  Pre-operative Diagnosis: hand warts Post-operative Diagnosis: same  Locations: see above  Indications: pain  Anesthesia: none  Procedure Details   Patient informed of risks (permanent scarring, infection, light or dark discoloration, bleeding, infection, weakness, numbness and recurrence of the lesion) and benefits of the procedure and verbal informed consent obtained. Universal time out performed  The areas are treated with liquid nitrogen therapy, frozen until ice ball extended 2 mm beyond lesion, allowed to thaw, and treated  again. The patient tolerated procedure well.  The patient was instructed on post-op care, warned that there may be blister formation, redness and pain. Recommend OTC analgesia as needed for pain.  Condition: Stable  Complications: none.    Commons side effects, risks, benefits, and alternatives for medications and treatment plan prescribed today were discussed, and the patient expressed understanding of the given instructions. Patient is instructed to call or message via MyChart if he/she has any questions or concerns regarding our treatment plan. No barriers to understanding were identified. We discussed Red Flag symptoms and signs in detail. Patient expressed understanding regarding what to do in case of urgent or emergency type symptoms.   Medication list was reconciled, printed and provided to the patient in AVS. Patient instructions and summary information was reviewed with the patient as documented in the AVS. This note was prepared with assistance of Dragon voice recognition software. Occasional wrong-word or sound-a-like substitutions may have occurred due to the inherent limitations of voice recognition software  This visit occurred during the SARS-CoV-2 public health emergency.  Safety protocols were in place, including screening questions prior to the visit, additional usage of staff PPE, and extensive cleaning of exam room while observing appropriate contact time as indicated for disinfecting solutions.

## 2020-02-19 NOTE — Patient Instructions (Signed)
Wear the figure of eight ankle support daily for 2 weeks.  Continue range of motion exercises until pain free. Then start rehab and strengthening exercise.    Ankle Sprain  An ankle sprain is a stretch or tear in a ligament in the ankle. Ligaments are tissues that connect bones to each other. The two most common types of ankle sprains are:  Inversion sprain. This happens when the foot turns inward and the ankle rolls outward. It affects the ligament on the outside of the foot (lateral ligament).  Eversion sprain. This happens when the foot turns outward and the ankle rolls inward. It affects the ligament on the inner side of the foot (medial ligament). What are the causes? This condition is often caused by accidentally rolling or twisting the ankle. What increases the risk? You are more likely to develop this condition if you play sports. What are the signs or symptoms? Symptoms of this condition include:  Pain in your ankle.  Swelling.  Bruising. This may develop right after you sprain your ankle or 1-2 days later.  Trouble standing or walking, especially when you turn or change directions. How is this diagnosed? This condition is diagnosed with:  A physical exam. During the exam, your health care provider will press on certain parts of your foot and ankle and try to move them in certain ways.  X-ray imaging. These may be taken to see how severe the sprain is and to check for broken bones. How is this treated? This condition may be treated with:  A brace or splint. This is used to keep the ankle from moving until it heals.  An elastic bandage. This is used to support the ankle.  Crutches.  Pain medicine.  Surgery. This may be needed if the sprain is severe.  Physical therapy. This may help to improve the range of motion in the ankle. Follow these instructions at home: If you have a brace or a splint:  Wear the brace or splint as told by your health care provider.  Remove it only as told by your health care provider.  Loosen the brace or splint if your toes tingle, become numb, or turn cold and blue.  Keep the brace or splint clean.  If the brace or splint is not waterproof: ? Do not let it get wet. ? Cover it with a watertight covering when you take a bath or a shower. If you have an elastic bandage (dressing):  Remove it to shower or bathe.  Try not to move your ankle much, but wiggle your toes from time to time. This helps to prevent swelling.  Adjust the dressing to make it more comfortable if it feels too tight.  Loosen the dressing if you have numbness or tingling in your foot, or if your foot becomes cold and blue. Managing pain, stiffness, and swelling   Take over-the-counter and prescription medicines only as told by your health care provider.  For 2-3 days, keep your ankle raised (elevated) above the level of your heart as much as possible.  If directed, put ice on the injured area: ? If you have a removable brace or splint, remove it as told by your health care provider. ? Put ice in a plastic bag. ? Place a towel between your skin and the bag. ? Leave the ice on for 20 minutes, 2-3 times a day. General instructions  Rest your ankle.  Do not use the injured limb to support your body weight until your  health care provider says that you can. Use crutches as told by your health care provider.  Do not use any products that contain nicotine or tobacco, such as cigarettes, e-cigarettes, and chewing tobacco. If you need help quitting, ask your health care provider.  Keep all follow-up visits as told by your health care provider. This is important. Contact a health care provider if:  You have rapidly increasing bruising or swelling.  Your pain is not relieved with medicine. Get help right away if:  Your foot or toes become numb or blue.  You have severe pain that gets worse. Summary  An ankle sprain is a stretch or tear in  a ligament in the ankle. Ligaments are tissues that connect bones to each other.  This condition is often caused by accidentally rolling or twisting the ankle.  Symptoms include pain, swelling, bruising, and trouble walking.  To relieve pain and swelling, put ice on the affected ankle, raise your ankle above the level of your heart, and use an elastic bandage.  Keep all follow-up visits as told by your health care provider. This is important. This information is not intended to replace advice given to you by your health care provider. Make sure you discuss any questions you have with your health care provider. Document Revised: 11/13/2017 Document Reviewed: 07/18/2017 Elsevier Patient Education  [place].

## 2020-02-20 ENCOUNTER — Encounter: Payer: Self-pay | Admitting: Family Medicine

## 2020-02-20 ENCOUNTER — Ambulatory Visit: Payer: Federal, State, Local not specified - PPO | Admitting: Family Medicine

## 2020-03-16 ENCOUNTER — Encounter: Payer: Self-pay | Admitting: Family Medicine

## 2020-03-19 ENCOUNTER — Other Ambulatory Visit: Payer: Federal, State, Local not specified - PPO

## 2020-03-19 DIAGNOSIS — Z20822 Contact with and (suspected) exposure to covid-19: Secondary | ICD-10-CM

## 2020-03-21 LAB — SARS-COV-2, NAA 2 DAY TAT

## 2020-03-21 LAB — NOVEL CORONAVIRUS, NAA: SARS-CoV-2, NAA: DETECTED — AB

## 2020-03-31 ENCOUNTER — Encounter: Payer: Self-pay | Admitting: Family Medicine

## 2020-04-02 NOTE — Telephone Encounter (Signed)
Please refer to PT for ankle sprain and notify pt. thanks

## 2020-04-06 ENCOUNTER — Other Ambulatory Visit: Payer: Self-pay

## 2020-04-06 DIAGNOSIS — S93401D Sprain of unspecified ligament of right ankle, subsequent encounter: Secondary | ICD-10-CM

## 2020-05-21 ENCOUNTER — Other Ambulatory Visit: Payer: Self-pay | Admitting: Family Medicine

## 2020-06-11 ENCOUNTER — Other Ambulatory Visit: Payer: Self-pay | Admitting: Family Medicine

## 2020-06-11 DIAGNOSIS — Z1231 Encounter for screening mammogram for malignant neoplasm of breast: Secondary | ICD-10-CM

## 2020-07-27 ENCOUNTER — Other Ambulatory Visit: Payer: Self-pay | Admitting: Family Medicine

## 2020-07-27 DIAGNOSIS — Z1231 Encounter for screening mammogram for malignant neoplasm of breast: Secondary | ICD-10-CM

## 2020-07-31 DIAGNOSIS — Z1231 Encounter for screening mammogram for malignant neoplasm of breast: Secondary | ICD-10-CM

## 2020-08-24 ENCOUNTER — Encounter: Payer: Federal, State, Local not specified - PPO | Admitting: Family Medicine

## 2020-09-21 ENCOUNTER — Ambulatory Visit
Admission: RE | Admit: 2020-09-21 | Discharge: 2020-09-21 | Disposition: A | Discharge status: Home / Self Care | Payer: Federal, State, Local not specified - PPO | Source: Ambulatory Visit

## 2020-09-21 ENCOUNTER — Other Ambulatory Visit: Payer: Self-pay

## 2020-09-21 DIAGNOSIS — Z1231 Encounter for screening mammogram for malignant neoplasm of breast: Secondary | ICD-10-CM

## 2020-12-09 ENCOUNTER — Ambulatory Visit (HOSPITAL_COMMUNITY)
Admission: RE | Admit: 2020-12-09 | Discharge: 2020-12-09 | Disposition: A | Discharge status: Home / Self Care | Payer: Federal, State, Local not specified - PPO | Source: Ambulatory Visit | Attending: Physician Assistant | Admitting: Physician Assistant

## 2020-12-09 ENCOUNTER — Encounter: Payer: Self-pay | Admitting: Physician Assistant

## 2020-12-09 ENCOUNTER — Ambulatory Visit: Payer: Federal, State, Local not specified - PPO | Admitting: Physician Assistant

## 2020-12-09 ENCOUNTER — Encounter (HOSPITAL_COMMUNITY): Payer: Self-pay

## 2020-12-09 ENCOUNTER — Other Ambulatory Visit: Payer: Self-pay

## 2020-12-09 VITALS — BP 111/75 | HR 82 | Temp 98.0°F | Ht 65.0 in | Wt 211.2 lb

## 2020-12-09 DIAGNOSIS — R110 Nausea: Secondary | ICD-10-CM | POA: Diagnosis not present

## 2020-12-09 DIAGNOSIS — S0990XA Unspecified injury of head, initial encounter: Secondary | ICD-10-CM | POA: Diagnosis not present

## 2020-12-09 DIAGNOSIS — Y9302 Activity, running: Secondary | ICD-10-CM | POA: Diagnosis not present

## 2020-12-09 DIAGNOSIS — W19XXXA Unspecified fall, initial encounter: Secondary | ICD-10-CM | POA: Diagnosis not present

## 2020-12-09 NOTE — Progress Notes (Signed)
Subjective:    Patient ID: Elaine Casey, female    DOB: 19-Mar-1975, 45 y.o.   MRN: [id number]  Chief Complaint  Patient presents with   Concussion    HPI Patient is in today for possible concussion.  DOI: Friday, 12/04/20  She was playing tennis with her 45 yo. Went to backhand and lost balance and fell hard, hitting right knee, right hand and right side of her head. No LOC. She could not play anymore after the fall. She did ice that night.  Low-level nausea and headache since then. Felt motion sickness when turning her head too fast. Vision is ok. Feels safe driving. No dizziness or lightheadedness. No issues with lights or sounds. No neck pain. No CP or SOB. No prior head injuries or concussions. Not on any blood thinners.   Diffuse, frontal headache 2-3/10 pain currently. Never any worse than that.    Past Medical History:  Diagnosis Date   Allergy    Depression    Heart murmur     Past Surgical History:  Procedure Laterality Date   LAPAROSCOPIC APPENDECTOMY N/A 12/18/2019   Procedure: APPENDECTOMY LAPAROSCOPIC;  Surgeon: Greer Pickerel, MD;  Location: [facility];  Service: General;  Laterality: N/A;    Family History  Problem Relation Age of Onset   Diabetes Mother    High Cholesterol Mother    High blood pressure Mother    COPD Father    Depression Father    Learning disabilities Father    Healthy Daughter    Healthy Daughter    Cancer Maternal Grandmother    Diabetes Maternal Grandmother    Heart disease Maternal Grandmother    High Cholesterol Maternal Grandmother    Stroke Maternal Grandmother    High blood pressure Maternal Grandmother     Social History   Tobacco Use   Smoking status: Never   Smokeless tobacco: Never  Substance Use Topics   Alcohol use: Never   Drug use: Never     No Known Allergies  Review of Systems REFER TO HPI FOR PERTINENT POSITIVES AND NEGATIVES      Objective:     BP 111/75   Pulse 82   Temp 98 F (36.7 C)   Ht  5\' 5"  (1.651 m)   Wt 211 lb 3.2 oz (95.8 kg)   LMP 11/27/2020 (Approximate)   SpO2 95%   BMI 35.15 kg/m   Wt Readings from Last 3 Encounters:  12/09/20 211 lb 3.2 oz (95.8 kg)  02/19/20 204 lb 6.4 oz (92.7 kg)  01/29/20 203 lb (92.1 kg)    BP Readings from Last 3 Encounters:  12/09/20 111/75  02/19/20 108/72  01/29/20 112/78     Physical Exam Vitals and nursing note reviewed.  Constitutional:      Appearance: Normal appearance. She is normal weight. She is not toxic-appearing.  HENT:     Head: Normocephalic and atraumatic.      Right Ear: Tympanic membrane, ear canal and external ear normal.     Left Ear: Tympanic membrane, ear canal and external ear normal.     Nose: Nose normal.     Mouth/Throat:     Mouth: Mucous membranes are moist.  Eyes:     Extraocular Movements: Extraocular movements intact.     Conjunctiva/sclera: Conjunctivae normal.     Pupils: Pupils are equal, round, and reactive to light.  Cardiovascular:     Rate and Rhythm: Normal rate and regular rhythm.     Pulses:  Normal pulses.     Heart sounds: Normal heart sounds.  Pulmonary:     Effort: Pulmonary effort is normal.     Breath sounds: Normal breath sounds.  Abdominal:     General: Abdomen is flat. Bowel sounds are normal.     Palpations: Abdomen is soft.  Musculoskeletal:        General: Normal range of motion.     Cervical back: Normal range of motion and neck supple.  Skin:    General: Skin is warm and dry.  Neurological:     General: No focal deficit present.     Mental Status: She is alert and oriented to person, place, and time.     Cranial Nerves: No cranial nerve deficit.     Sensory: No sensory deficit.     Motor: No weakness.     Coordination: Coordination normal.     Gait: Gait normal.     Deep Tendon Reflexes: Reflexes normal.  Psychiatric:        Mood and Affect: Mood normal.        Behavior: Behavior normal.        Thought Content: Thought content normal.         Judgment: Judgment normal.       Assessment & Plan:   Problem List Items Addressed This Visit   None Visit Diagnoses     Traumatic injury of head, initial encounter    -  Primary   Relevant Orders   CT HEAD WO CONTRAST (5MM) (Completed)   Fall while running, initial encounter       Relevant Orders   CT HEAD WO CONTRAST (5MM) (Completed)   Nausea       Relevant Orders   CT HEAD WO CONTRAST (5MM) (Completed)       1. Traumatic injury of head, initial encounter 2. Fall while running, initial encounter 3. Nausea - Risks vs benefits discussed with patient about CT head - Both agreed together today because of her new nausea and ongoing headache since incident, will go ahead with CT today to absolutely rule out any fracture or bleed.  - Most likely has concussion, and concussion treatments including BRAIN REST highly emphasized - She knows to go to the ED in case of any sudden changes    This note was prepared with assistance of Dragon voice recognition software. Occasional wrong-word or sound-a-like substitutions may have occurred due to the inherent limitations of voice recognition software.   Eleri Ruben M Aahana Elza, PA-C

## 2020-12-09 NOTE — Patient Instructions (Signed)
STAT CT head today to r/o skull fracture with delayed bleed

## 2021-01-04 ENCOUNTER — Encounter: Payer: Self-pay | Admitting: Family Medicine

## 2021-01-11 ENCOUNTER — Encounter: Payer: Self-pay | Admitting: Family Medicine

## 2021-01-11 ENCOUNTER — Ambulatory Visit (INDEPENDENT_AMBULATORY_CARE_PROVIDER_SITE_OTHER): Payer: Federal, State, Local not specified - PPO | Admitting: Family Medicine

## 2021-01-11 ENCOUNTER — Other Ambulatory Visit: Payer: Self-pay

## 2021-01-11 VITALS — BP 120/78 | HR 91 | Temp 98.2°F | Ht 65.0 in | Wt 209.0 lb

## 2021-01-11 DIAGNOSIS — Z1212 Encounter for screening for malignant neoplasm of rectum: Secondary | ICD-10-CM

## 2021-01-11 DIAGNOSIS — Z1211 Encounter for screening for malignant neoplasm of colon: Secondary | ICD-10-CM | POA: Diagnosis not present

## 2021-01-11 DIAGNOSIS — Z0000 Encounter for general adult medical examination without abnormal findings: Secondary | ICD-10-CM

## 2021-01-11 DIAGNOSIS — E669 Obesity, unspecified: Secondary | ICD-10-CM | POA: Diagnosis not present

## 2021-01-11 DIAGNOSIS — Z1231 Encounter for screening mammogram for malignant neoplasm of breast: Secondary | ICD-10-CM

## 2021-01-11 DIAGNOSIS — F339 Major depressive disorder, recurrent, unspecified: Secondary | ICD-10-CM

## 2021-01-11 DIAGNOSIS — J0100 Acute maxillary sinusitis, unspecified: Secondary | ICD-10-CM

## 2021-01-11 DIAGNOSIS — R059 Cough, unspecified: Secondary | ICD-10-CM | POA: Diagnosis not present

## 2021-01-11 DIAGNOSIS — Z3041 Encounter for surveillance of contraceptive pills: Secondary | ICD-10-CM

## 2021-01-11 LAB — COMPREHENSIVE METABOLIC PANEL
ALT: 9 U/L (ref 0–35)
AST: 15 U/L (ref 0–37)
Albumin: 4.2 g/dL (ref 3.5–5.2)
Alkaline Phosphatase: 73 U/L (ref 39–117)
BUN: 12 mg/dL (ref 6–23)
CO2: 25 mEq/L (ref 19–32)
Calcium: 9.1 mg/dL (ref 8.4–10.5)
Chloride: 100 mEq/L (ref 96–112)
Creatinine, Ser: 0.76 mg/dL (ref 0.40–1.20)
GFR: 94.48 mL/min (ref >60.00)
Glucose, Bld: 94 mg/dL (ref 70–99)
Potassium: 4.1 mEq/L (ref 3.5–5.1)
Sodium: 135 mEq/L (ref 135–145)
Total Bilirubin: 0.3 mg/dL (ref 0.2–1.2)
Total Protein: 7.1 g/dL (ref 6.0–8.3)

## 2021-01-11 LAB — LDL CHOLESTEROL, DIRECT: Direct LDL: 130 mg/dL

## 2021-01-11 LAB — CBC WITH DIFFERENTIAL/PLATELET
Basophils Absolute: 0.1 10*3/uL (ref 0.0–0.1)
Basophils Relative: 1 % (ref 0.0–3.0)
Eosinophils Absolute: 0.5 10*3/uL (ref 0.0–0.7)
Eosinophils Relative: 4.5 % (ref 0.0–5.0)
HCT: 37.8 % (ref 36.0–46.0)
Hemoglobin: 12.1 g/dL (ref 12.0–15.0)
Lymphocytes Relative: 25.7 % (ref 12.0–46.0)
Lymphs Abs: 2.9 10*3/uL (ref 0.7–4.0)
MCHC: 31.9 g/dL (ref 30.0–36.0)
MCV: 79.5 fl (ref 78.0–100.0)
Monocytes Absolute: 0.6 10*3/uL (ref 0.1–1.0)
Monocytes Relative: 5 % (ref 3.0–12.0)
Neutro Abs: 7.2 10*3/uL (ref 1.4–7.7)
Neutrophils Relative %: 63.8 % (ref 43.0–77.0)
Platelets: 335 10*3/uL (ref 150.0–400.0)
RBC: 4.76 Mil/uL (ref 3.87–5.11)
RDW: 13.9 % (ref 11.5–15.5)
WBC: 11.3 10*3/uL — ABNORMAL HIGH (ref 4.0–10.5)

## 2021-01-11 LAB — LIPID PANEL
Cholesterol: 222 mg/dL — ABNORMAL HIGH (ref 0–200)
HDL: 58.1 mg/dL (ref >39.00)
NonHDL: 163.73
Total CHOL/HDL Ratio: 4
Triglycerides: 232 mg/dL — ABNORMAL HIGH (ref 0.0–149.0)
VLDL: 46.4 mg/dL — ABNORMAL HIGH (ref 0.0–40.0)

## 2021-01-11 LAB — HEMOGLOBIN A1C: Hgb A1c MFr Bld: 5.9 % (ref 4.6–6.5)

## 2021-01-11 LAB — TSH: TSH: 1.31 u[IU]/mL (ref 0.35–5.50)

## 2021-01-11 LAB — POC COVID19 BINAXNOW: SARS Coronavirus 2 Ag: NEGATIVE

## 2021-01-11 MED ORDER — AZITHROMYCIN 250 MG PO TABS: ORAL_TABLET | ORAL | 0 refills | Status: DC

## 2021-01-11 NOTE — Patient Instructions (Addendum)
Please return in 12 months for your annual complete physical; please come fasting.   I will release your lab results to you on your MyChart account with further instructions. Please reply with any questions.    I have ordered a mammogram and/or bone density for you as we discussed today: [x]   Mammogram  []   Bone Density  Please call the office checked below to schedule your appointment:  [x]   [facility]      [place], [place]         []   [facility]  [address] [place], [place]   If you have any questions or concerns, please don't hesitate to send me a message via MyChart or call the office at [phone]. Thank you for visiting with [place] today! It's our pleasure caring for you.   Please do these things to maintain good health!  Exercise at least 30-45 minutes a day,  4-5 days a week.  Eat a low-fat diet with lots of fruits and vegetables, up to 7-9 servings per day. Drink plenty of water daily. Try to drink 8 8oz glasses per day. Seatbelts can save your life. Always wear your seatbelt. Place Smoke Detectors on every level of your home and check batteries every year. Schedule an appointment with an eye doctor for an eye exam every 1-2 years Safe sex - use condoms to protect yourself from STDs if you could be exposed to these types of infections. Use birth control if you do not want to become pregnant and are sexually active. Avoid heavy alcohol use. If you drink, keep it to less than 2 drinks/day and not every day. [place].  Choose someone you trust that could speak for you if you became unable to speak for yourself. Depression is common in our stressful world.If you're feeling down or losing interest in things you normally enjoy, please come in for a visit. If anyone is threatening or hurting you, please get help. Physical or Emotional Violence is never OK.

## 2021-01-11 NOTE — Progress Notes (Signed)
Subjective  Chief Complaint  Patient presents with   Annual Exam    Not fasting   Sinus Problem    Headache, congestion, nauseous, diarrhea, cough has not taken a covid test. Ongoing for 7 days    HPI: Elaine Casey is a 45 y.o. female who presents to [facility] [place] today for a Female Wellness Visit. She also has the concerns and/or needs as listed above in the chief complaint. These will be addressed in addition to the Health Maintenance Visit.   Wellness Visit: annual visit with health maintenance review and exam without Pap  Health maintenance: Eligible for colon cancer screening.  Due for mammogram.  Pap smear screening up-to-date.  Doing well overall.  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 Chronic disease f/u and/or acute problem visit: (deemed necessary to be done in addition to the wellness visit): Complains of 7-day history of cough, congestion, malaise, myalgia with early diarrhea that has since resolved.  Decreased appetite.  No shortness of breath.  Has sinus pain and headache.  Daughter had similar illness that resolved on its own.  Daughter had COVID testing that was negative twice.  Patient has not yet tested.  She is not getting worse but feels that sinus symptoms are persistent.  Has significant thick drainage.  Using over-the-counter medications without relief On oral contraceptives without adverse effects. Chronic major depression managed by psychiatry.  Reviewed medications.  She reports is well controlled  Assessment  1. Annual physical exam   2. Screening for colorectal cancer   3. Cough, unspecified type   4. Obesity (BMI 30-39.9)   5. Acute non-recurrent maxillary sinusitis   6. Encounter for screening mammogram for breast cancer   7. Oral contraceptive use   8. Major depression, recurrent, chronic [facility])      Plan  Female Wellness Visit: Age appropriate Health Maintenance and Prevention measures were  discussed with patient. Included topics are cancer screening recommendations, ways to keep healthy (see AVS) including dietary and exercise recommendations, regular eye and dental care, use of seat belts, and avoidance of moderate alcohol use and tobacco use.  Refer her for colonoscopy and mammogram BMI: discussed patient's BMI and encouraged positive lifestyle modifications to help get to or maintain a target BMI. HM needs and immunizations were addressed and ordered. See below for orders. See HM and immunization section for updates. Routine labs and screening tests ordered including cmp, cbc and lipids where appropriate. Discussed recommendations regarding Vit D and calcium supplementation (see AVS)  Chronic disease management visit and/or acute problem visit: Treat for sinusitis with Z-Pak.  COVID-negative in the office Continue oral contraceptives and depression medications per psychiatry. Follow up: No follow-ups on file.  Orders Placed This Encounter  Procedures   MM DIGITAL SCREENING BILATERAL   CBC with Differential/Platelet   Comprehensive metabolic panel   Lipid panel   TSH   Hepatitis C antibody   HIV Antibody (routine testing w rflx)   Hemoglobin A1c   Ambulatory referral to Gastroenterology   POC COVID-19   Meds ordered this encounter  Medications   azithromycin (ZITHROMAX) 250 MG tablet    Sig: Take 2 tabs today, then 1 tab daily for 4 days    Dispense:  1 each    Refill:  0       Body mass index is 34.78 kg/m. Wt Readings from Last 3 Encounters:  01/11/21 209 lb (94.8 kg)  12/09/20 211 lb  3.2 oz (95.8 kg)  02/19/20 204 lb 6.4 oz (92.7 kg)     Patient Active Problem List   Diagnosis Date Noted   Oral contraceptive use 01/11/2021   Major depression, recurrent, chronic ([place]) 01/11/2021   OSA (obstructive sleep apnea) - mild, no CPAP 09/02/2019   Health Maintenance  Topic Date Due   HIV Screening  Never done   Hepatitis C Screening  Never done    COLONOSCOPY (Pts 45-56yrs Insurance coverage will need to be confirmed)  Never done   PAP SMEAR-Modifier  04/01/2024   TETANUS/TDAP  04/01/2029   INFLUENZA VACCINE  Completed   COVID-19 Vaccine  Completed   Pneumococcal Vaccine 65-82 Years old  Aged Out   HPV VACCINES  Aged Out   Immunization History  Administered Date(s) Administered   Influenza,inj,Quad PF,6+ Mos 01/04/2019, 12/02/2019   Influenza-Unspecified 12/29/2020   PFIZER(Purple Top)SARS-COV-2 Vaccination 04/24/2019, 05/15/2019   Pfizer Covid-19 Vaccine Bivalent Booster 21yrs & up 12/17/2020   Tdap 04/02/2019   We updated and reviewed the patient's past history in detail and it is documented below. Allergies: Patient has No Known Allergies. Past Medical History Patient  has a past medical history of Allergy, Depression, and Heart murmur. Past Surgical History Patient  has a past surgical history that includes laparoscopic appendectomy (N/A, 12/18/2019). Family History: Patient family history includes COPD in her father; Cancer in her maternal grandmother; Depression in her father; Diabetes in her maternal grandmother and mother; Healthy in her daughter and daughter; Heart disease in her maternal grandmother; High Cholesterol in her maternal grandmother and mother; High blood pressure in her maternal grandmother and mother; Learning disabilities in her father; Stroke in her maternal grandmother. Social History:  Patient  reports that she has never smoked. She has never used smokeless tobacco. She reports that she does not drink alcohol and does not use drugs.  Review of Systems: Constitutional: negative for fever or malaise Ophthalmic: negative for photophobia, double vision or loss of vision Cardiovascular: negative for chest pain, dyspnea on exertion, or new LE swelling Respiratory: negative for SOB or persistent cough Gastrointestinal: negative for abdominal pain, change in bowel habits or melena Genitourinary: negative  for dysuria or gross hematuria, no abnormal uterine bleeding or disharge Musculoskeletal: negative for new gait disturbance or muscular weakness Integumentary: negative for new or persistent rashes, no breast lumps Neurological: negative for TIA or stroke symptoms Psychiatric: negative for SI or delusions Allergic/Immunologic: negative for hives  Patient Care Team    Relationship Specialty Notifications Start End  Leamon Arnt, MD PCP - General Family Medicine  04/02/19   Chucky May, MD Consulting Physician Psychiatry  04/02/19     Objective  Vitals: BP 120/78   Pulse 91   Temp 98.2 F (36.8 C) (Temporal)   Ht 5\' 5"  (1.651 m)   Wt 209 lb (94.8 kg)   LMP 12/28/2020 (Approximate)   SpO2 98%   BMI 34.78 kg/m  General:  Well developed, well nourished, no acute distress, congested Psych:  Alert and orientedx3,normal mood and affect HEENT:  Normocephalic, atraumatic, non-icteric sclera,  supple neck without adenopathy, mass or thyromegaly Cardiovascular:  Normal S1, S2, RRR without gallop, rub or murmur Respiratory:  Good breath sounds bilaterally, CTAB with normal respiratory effort Gastrointestinal: normal bowel sounds, soft, non-tender, no noted masses. No HSM MSK: no deformities, contusions. Joints are without erythema or swelling.  Skin:  Warm, no rashes or suspicious lesions noted   Commons side effects, risks, benefits, and alternatives for medications  and treatment plan prescribed today were discussed, and the patient expressed understanding of the given instructions. Patient is instructed to call or message via MyChart if he/she has any questions or concerns regarding our treatment plan. No barriers to understanding were identified. We discussed Red Flag symptoms and signs in detail. Patient expressed understanding regarding what to do in case of urgent or emergency type symptoms.  Medication list was reconciled, printed and provided to the patient in AVS. Patient  instructions and summary information was reviewed with the patient as documented in the AVS. This note was prepared with assistance of Dragon voice recognition software. Occasional wrong-word or sound-a-like substitutions may have occurred due to the inherent limitations of voice recognition software  This visit occurred during the SARS-CoV-2 public health emergency.  Safety protocols were in place, including screening questions prior to the visit, additional usage of staff PPE, and extensive cleaning of exam room while observing appropriate contact time as indicated for disinfecting solutions.

## 2021-01-12 LAB — HIV ANTIBODY (ROUTINE TESTING W REFLEX): HIV 1&2 Ab, 4th Generation: NONREACTIVE

## 2021-01-12 LAB — HEPATITIS C ANTIBODY
Hepatitis C Ab: NONREACTIVE
SIGNAL TO CUT-OFF: 0.1 (ref <1.00)

## 2021-01-13 ENCOUNTER — Other Ambulatory Visit: Payer: Self-pay

## 2021-01-13 MED ORDER — NORGESTIMATE-ETH ESTRADIOL 0.25-35 MG-MCG PO TABS: 1.0000 | ORAL_TABLET | Freq: Every day | ORAL | 3 refills | Status: DC

## 2021-01-18 ENCOUNTER — Encounter: Payer: Self-pay | Admitting: Internal Medicine

## 2021-02-12 ENCOUNTER — Telehealth: Payer: Self-pay | Admitting: *Deleted

## 2021-02-12 NOTE — Telephone Encounter (Signed)
Noted. Thanks.

## 2021-02-12 NOTE — Telephone Encounter (Signed)
John,  Please review. [place] for [employer]? Thanks, Trenten Watchman pv

## 2021-02-25 ENCOUNTER — Other Ambulatory Visit: Payer: Self-pay

## 2021-02-25 DIAGNOSIS — K8020 Calculus of gallbladder without cholecystitis without obstruction: Secondary | ICD-10-CM | POA: Insufficient documentation

## 2021-02-25 DIAGNOSIS — Z79899 Other long term (current) drug therapy: Secondary | ICD-10-CM | POA: Diagnosis not present

## 2021-02-25 DIAGNOSIS — R1011 Right upper quadrant pain: Secondary | ICD-10-CM | POA: Diagnosis present

## 2021-02-26 ENCOUNTER — Emergency Department (HOSPITAL_COMMUNITY)
Admission: EM | Admit: 2021-02-26 | Discharge: 2021-02-26 | Disposition: A | Discharge status: Home / Self Care | Payer: Federal, State, Local not specified - PPO | Attending: Emergency Medicine | Admitting: Emergency Medicine

## 2021-02-26 ENCOUNTER — Encounter (HOSPITAL_COMMUNITY): Payer: Self-pay

## 2021-02-26 ENCOUNTER — Other Ambulatory Visit: Payer: Self-pay

## 2021-02-26 ENCOUNTER — Emergency Department (HOSPITAL_COMMUNITY): Payer: Federal, State, Local not specified - PPO

## 2021-02-26 DIAGNOSIS — K8050 Calculus of bile duct without cholangitis or cholecystitis without obstruction: Secondary | ICD-10-CM

## 2021-02-26 DIAGNOSIS — R1011 Right upper quadrant pain: Secondary | ICD-10-CM

## 2021-02-26 DIAGNOSIS — K8020 Calculus of gallbladder without cholecystitis without obstruction: Secondary | ICD-10-CM

## 2021-02-26 LAB — CBC WITH DIFFERENTIAL/PLATELET
Abs Immature Granulocytes: 0.09 10*3/uL — ABNORMAL HIGH (ref 0.00–0.07)
Basophils Absolute: 0.1 10*3/uL (ref 0.0–0.1)
Basophils Relative: 1 %
Eosinophils Absolute: 0.2 10*3/uL (ref 0.0–0.5)
Eosinophils Relative: 1 %
HCT: 40.4 % (ref 36.0–46.0)
Hemoglobin: 12.8 g/dL (ref 12.0–15.0)
Immature Granulocytes: 1 %
Lymphocytes Relative: 16 %
Lymphs Abs: 2.8 10*3/uL (ref 0.7–4.0)
MCH: 26.2 pg (ref 26.0–34.0)
MCHC: 31.7 g/dL (ref 30.0–36.0)
MCV: 82.8 fL (ref 80.0–100.0)
Monocytes Absolute: 1.3 10*3/uL — ABNORMAL HIGH (ref 0.1–1.0)
Monocytes Relative: 7 %
Neutro Abs: 13.8 10*3/uL — ABNORMAL HIGH (ref 1.7–7.7)
Neutrophils Relative %: 74 %
Platelets: 363 10*3/uL (ref 150–400)
RBC: 4.88 MIL/uL (ref 3.87–5.11)
RDW: 13.8 % (ref 11.5–15.5)
WBC: 18.3 10*3/uL — ABNORMAL HIGH (ref 4.0–10.5)
nRBC: 0 % (ref 0.0–0.2)

## 2021-02-26 LAB — COMPREHENSIVE METABOLIC PANEL
ALT: 20 U/L (ref 0–44)
AST: 46 U/L — ABNORMAL HIGH (ref 15–41)
Albumin: 3.8 g/dL (ref 3.5–5.0)
Alkaline Phosphatase: 75 U/L (ref 38–126)
Anion gap: 9 (ref 5–15)
BUN: 12 mg/dL (ref 6–20)
CO2: 26 mmol/L (ref 22–32)
Calcium: 9.1 mg/dL (ref 8.9–10.3)
Chloride: 97 mmol/L — ABNORMAL LOW (ref 98–111)
Creatinine, Ser: 0.81 mg/dL (ref 0.44–1.00)
GFR, Estimated: >60 mL/min (ref >60)
Glucose, Bld: 115 mg/dL — ABNORMAL HIGH (ref 70–99)
Potassium: 4.4 mmol/L (ref 3.5–5.1)
Sodium: 132 mmol/L — ABNORMAL LOW (ref 135–145)
Total Bilirubin: 0.4 mg/dL (ref 0.3–1.2)
Total Protein: 7.4 g/dL (ref 6.5–8.1)

## 2021-02-26 LAB — LIPASE, BLOOD: Lipase: 47 U/L (ref 11–51)

## 2021-02-26 LAB — TROPONIN I (HIGH SENSITIVITY)
Troponin I (High Sensitivity): 4 ng/L (ref <18)
Troponin I (High Sensitivity): 5 ng/L (ref <18)

## 2021-02-26 LAB — I-STAT BETA HCG BLOOD, ED (MC, WL, AP ONLY): I-stat hCG, quantitative: <5 m[IU]/mL (ref <5)

## 2021-02-26 MED ORDER — OXYCODONE HCL 5 MG PO TABS: 2.5000 mg | ORAL_TABLET | Freq: Four times a day (QID) | ORAL | 0 refills | Status: DC | PRN

## 2021-02-26 MED ORDER — OXYCODONE HCL 5 MG PO TABS
10.0000 mg | ORAL_TABLET | Freq: Once | ORAL | Status: AC
  Administered 2021-02-26: 01:00:00 10 mg via ORAL
  Filled 2021-02-26: qty 2

## 2021-02-26 MED ORDER — CEFAZOLIN SODIUM-DEXTROSE 1-4 GM/50ML-% IV SOLN: 1.0000 g | Freq: Once | INTRAVENOUS | Status: DC

## 2021-02-26 MED ORDER — ONDANSETRON HCL 4 MG PO TABS: 4.0000 mg | ORAL_TABLET | Freq: Three times a day (TID) | ORAL | 0 refills | Status: DC | PRN

## 2021-02-26 MED ORDER — ONDANSETRON 4 MG PO TBDP
4.0000 mg | ORAL_TABLET | Freq: Once | ORAL | Status: AC
  Administered 2021-02-26: 01:00:00 4 mg via ORAL
  Filled 2021-02-26: qty 1

## 2021-02-26 NOTE — ED Triage Notes (Signed)
Pt c/o right side flank pain that started tonight. Pt reports nausea, denies vomiting.

## 2021-02-26 NOTE — ED Provider Notes (Signed)
[facility] EMERGENCY DEPARTMENT Provider Note   CSN: [id number] Arrival date & time: 02/25/21  2333     History Chief Complaint  Patient presents with   Flank Pain    Elaine Casey is a 45 y.o. female who presents with a cc of RUQ abd pain. Onset night at 9 PM.  She states it started as reflux and gross localized to the right side.  Pain was 10 out of 10 upon arrival.  No associated nausea or vomiting.  Pain did not radiate.  States she has had a similar pain about a year and a half ago and once before that.  She denies any changes in the color of her stool or urine, vomiting.  Pain is currently resolved at this time.      Flank Pain      Past Medical History:  Diagnosis Date   Allergy    Depression    Heart murmur     Patient Active Problem List   Diagnosis Date Noted   Oral contraceptive use 01/11/2021   Major depression, recurrent, chronic ([place]) 01/11/2021   OSA (obstructive sleep apnea) - mild, no CPAP 09/02/2019    Past Surgical History:  Procedure Laterality Date   LAPAROSCOPIC APPENDECTOMY N/A 12/18/2019   Procedure: APPENDECTOMY LAPAROSCOPIC;  Surgeon: Greer Pickerel, MD;  Location: [place];  Service: General;  Laterality: N/A;     OB History   No obstetric history on file.     Family History  Problem Relation Age of Onset   Diabetes Mother    High Cholesterol Mother    High blood pressure Mother    COPD Father    Depression Father    Learning disabilities Father    Healthy Daughter    Healthy Daughter    Cancer Maternal Grandmother    Diabetes Maternal Grandmother    Heart disease Maternal Grandmother    High Cholesterol Maternal Grandmother    Stroke Maternal Grandmother    High blood pressure Maternal Grandmother     Social History   Tobacco Use   Smoking status: Never   Smokeless tobacco: Never  Substance Use Topics   Alcohol use: Never   Drug use: Never    Home Medications Prior to  Admission medications   Medication Sig Start Date End Date Taking? Authorizing Provider  azithromycin (ZITHROMAX) 250 MG tablet Take 2 tabs today, then 1 tab daily for 4 days 01/11/21   Leamon Arnt, MD  buPROPion (WELLBUTRIN XL) 150 MG 24 hr tablet Take 300 mg by mouth daily. 09/19/19   [provider]  escitalopram (LEXAPRO) 20 MG tablet Take 20 mg by mouth daily.    [provider]  ferrous sulfate 325 (65 FE) MG tablet Take 325 mg by mouth daily with breakfast.    [provider]  lamoTRIgine (LAMICTAL) 150 MG tablet Take 150 mg by mouth daily.    [provider]  norgestimate-ethinyl estradiol (Phenix 28) 0.25-35 MG-MCG tablet Take 1 tablet by mouth daily. 01/13/21   Leamon Arnt, MD    Allergies    Patient has no known allergies.  Review of Systems   Review of Systems  Genitourinary:  Positive for flank pain.  Ten systems reviewed and are negative for acute change, except as noted in the HPI.   Physical Exam Updated Vital Signs BP 122/83    Pulse 81    Temp 98.1 F (36.7 C) (Oral)    Resp 16  Ht 5\' 5"  (1.651 m)    Wt 93 kg    SpO2 100%    BMI 34.11 kg/m   Physical Exam Vitals and nursing note reviewed.  Constitutional:      General: She is not in acute distress.    Appearance: She is well-developed. She is not diaphoretic.  HENT:     Head: Normocephalic and atraumatic.     Right Ear: External ear normal.     Left Ear: External ear normal.     Nose: Nose normal.     Mouth/Throat:     Mouth: Mucous membranes are moist.  Eyes:     General: No scleral icterus.    Conjunctiva/sclera: Conjunctivae normal.  Cardiovascular:     Rate and Rhythm: Normal rate and regular rhythm.     Heart sounds: Normal heart sounds. No murmur heard.   No friction rub. No gallop.  Pulmonary:     Effort: Pulmonary effort is normal. No respiratory distress.     Breath sounds: Normal breath sounds.  Abdominal:     General: Bowel sounds are normal.  There is no distension.     Palpations: Abdomen is soft. There is no mass.     Tenderness: There is no abdominal tenderness. There is no guarding.  Musculoskeletal:     Cervical back: Normal range of motion.  Skin:    General: Skin is warm and dry.  Neurological:     Mental Status: She is alert and oriented to person, place, and time.  Psychiatric:        Behavior: Behavior normal.    ED Results / Procedures / Treatments   Labs (all labs ordered are listed, but only abnormal results are displayed) Labs Reviewed  CBC WITH DIFFERENTIAL/PLATELET - Abnormal; Notable for the following components:      Result Value   WBC 18.3 (*)    Neutro Abs 13.8 (*)    Monocytes Absolute 1.3 (*)    Abs Immature Granulocytes 0.09 (*)    All other components within normal limits  COMPREHENSIVE METABOLIC PANEL - Abnormal; Notable for the following components:   Sodium 132 (*)    Chloride 97 (*)    Glucose, Bld 115 (*)    AST 46 (*)    All other components within normal limits  LIPASE, BLOOD  I-STAT BETA HCG BLOOD, ED ([facility], [facility], [facility] ONLY)  TROPONIN I (HIGH SENSITIVITY)  TROPONIN I (HIGH SENSITIVITY)    EKG None  Radiology US Abdomen Limited RUQ (LIVER/GB)  Result Date: 02/26/2021 CLINICAL DATA:  Right upper quadrant pain EXAM: ULTRASOUND ABDOMEN LIMITED RIGHT UPPER QUADRANT COMPARISON:  12/18/2019 FINDINGS: Gallbladder: Gallbladder is well distended. There is a stone in the gallbladder neck. No wall thickening or pericholecystic fluid is noted. Common bile duct: Diameter: 7 mm.  This is mildly prominent for the patient's age. Liver: No focal lesion identified. Within normal limits in parenchymal echogenicity. Portal vein is patent on color Doppler imaging with normal direction of blood flow towards the liver. Other: None. IMPRESSION: Cholelithiasis as described without complicating factors. Mild prominence of the common bile duct as described. Correlate with laboratory values. Electronically Signed    By: Inez Catalina M.D.   On: 02/26/2021 01:30    Procedures Procedures   Medications Ordered in ED Medications  oxyCODONE (Oxy IR/ROXICODONE) immediate release tablet 10 mg (10 mg Oral Given 02/26/21 0102)  ondansetron (ZOFRAN-ODT) disintegrating tablet 4 mg (4 mg Oral Given 02/26/21 0102)    ED Course  I  have reviewed the triage vital signs and the nursing notes.  Pertinent labs & imaging results that were available during my care of the patient were reviewed by me and considered in my medical decision making (see chart for details).    MDM Rules/Calculators/A&P                         This is a 45 year old female with right upper quadrant abdominal pain last night now resolved The emergent DDX for RUQ pain includes but is not limited to Glabladder disease, PUD, Acute Hepatitis, Pancreatitis, pyelonephritis, Pneumonia, Lower lobe PE/Infarct, Kidney stone, GERD, retrocecal appendicitis, Fitz-Hugh-Curtis syndrome, AAA, MI, Zoster. Patient with right upper quadrant ultrasound showed cholelithiasis without obstruction or cholecystitis.  I reviewed patient's labs which shows mildly elevated white blood cell count likely due to acute phase reaction.  CMP shows no elevated liver enzymes of significant value.  Lipase within normal limits, normal troponin.  EKG shows normal sinus rhythm at a rate of 80.  And is currently pain-free and without evidence of acute cholecystitis.  Will discharge the patient with pain medications and nausea given it is a holiday weekend.  I have reviewed the PDMP.  Given referral to outpatient surgery for further evaluation and management.  Discussed return precautions   Final Clinical Impression(s) / ED Diagnoses Final diagnoses:  RUQ pain    Rx / DC Orders ED Discharge Orders     None        Margarita Mail, PA-C 02/26/21 1619    Regan Lemming, MD 02/26/21 [phone]

## 2021-02-26 NOTE — Discharge Instructions (Addendum)
Contact a health care provider if: Your pain lasts more than 5 hours. You vomit. You have a fever and chills. Your pain gets worse. Get help right away if: Your skin or the whites of your eyes look yellow (jaundice). Your have tea-colored urine and light-colored stools (feces). You are dizzy or you faint. 

## 2021-02-26 NOTE — ED Provider Notes (Signed)
Emergency Medicine Provider Triage Evaluation Note  Elaine Casey , a 45 y.o. female  was evaluated in triage.  Pt complains of RUQ abd pain.  Pt reports this started as indigestion around 9pm.  Reports she has had similar pain last year that resolved after 1 hour, but she was never evaluated.  Pt with associated nausea, no vomiting.  Pain is a 8/10 and radiates into the right chest.    Hx of appendectomy - no hx of cholecystectomy.   Review of Systems  Positive: RUQ abd pain, nausea Negative: Fever, chills  Physical Exam  BP (!) 141/85 (BP Location: Left Arm)    Pulse 81    Temp 98.2 F (36.8 C) (Oral)    Resp 16    Ht 5\' 5"  (1.651 m)    Wt 93 kg    SpO2 98%    BMI 34.11 kg/m  Gen:   Awake, uncomfortable Resp:  Normal effort  MSK:   Moves extremities without difficulty  Other:  Abd soft, nontender  Medical Decision Making  Medically screening exam initiated at 12:32 AM.  Appropriate orders placed.  Elaine Casey was informed that the remainder of the evaluation will be completed by another provider, this initial triage assessment does not replace that evaluation, and the importance of remaining in the ED until their evaluation is complete.  RUQ abd pain, right sided chest pain.   Taven Strite, Gwenlyn Perking 02/26/21 4585    Veryl Speak, MD 02/26/21 [phone]

## 2021-03-04 ENCOUNTER — Ambulatory Visit (AMBULATORY_SURGERY_CENTER): Payer: Federal, State, Local not specified - PPO | Admitting: *Deleted

## 2021-03-04 ENCOUNTER — Other Ambulatory Visit: Payer: Self-pay

## 2021-03-04 VITALS — Ht 65.0 in | Wt 205.0 lb

## 2021-03-04 DIAGNOSIS — Z1211 Encounter for screening for malignant neoplasm of colon: Secondary | ICD-10-CM

## 2021-03-04 MED ORDER — PLENVU 140 G PO SOLR: 1.0000 | ORAL | 0 refills | Status: DC

## 2021-03-04 NOTE — Progress Notes (Signed)
No egg or soy allergy known to patient  No issues known to pt with past sedation with any surgeries or procedures Patient denies ever being told they had issues or difficulty with intubation  No FH of Malignant Hyperthermia Pt is not on diet pills Pt is not on  home 02  Pt is not on blood thinners  Pt denies issues with constipation  No A fib or A flutter  Pt is fully vaccinated  for Covid   Plenvu  Coupon given to pt in PV today , Code to Pharmacy and  NO PA's for preps discussed with pt In PV today  Discussed with pt there will be an out-of-pocket cost for prep and that varies from $0 to 70 +  dollars - pt verbalized understanding   Due to the COVID-19 pandemic we are asking patients to follow certain guidelines in PV and the [place]   Pt aware of COVID protocols and [facility] guidelines   PV completed over the phone. Pt verified name, DOB, address and insurance during PV today.  Pt mailed instruction packet with copy of consent form to read and not return, and instructions.  Plenvu  coupon mailed in packet.  Pt encouraged to call with questions or issues.  If pt has My chart, procedure instructions sent via My Chart

## 2021-03-18 ENCOUNTER — Other Ambulatory Visit: Payer: Self-pay

## 2021-03-18 ENCOUNTER — Encounter: Payer: Self-pay | Admitting: Internal Medicine

## 2021-03-18 ENCOUNTER — Ambulatory Visit (AMBULATORY_SURGERY_CENTER): Payer: Federal, State, Local not specified - PPO | Admitting: Internal Medicine

## 2021-03-18 VITALS — BP 104/75 | HR 75 | Temp 97.1°F | Resp 12 | Ht 65.0 in

## 2021-03-18 DIAGNOSIS — D125 Benign neoplasm of sigmoid colon: Secondary | ICD-10-CM

## 2021-03-18 DIAGNOSIS — Z1211 Encounter for screening for malignant neoplasm of colon: Secondary | ICD-10-CM

## 2021-03-18 DIAGNOSIS — D122 Benign neoplasm of ascending colon: Secondary | ICD-10-CM

## 2021-03-18 DIAGNOSIS — D124 Benign neoplasm of descending colon: Secondary | ICD-10-CM

## 2021-03-18 DIAGNOSIS — K635 Polyp of colon: Secondary | ICD-10-CM

## 2021-03-18 MED ORDER — SODIUM CHLORIDE 0.9 % IV SOLN: 500.0000 mL | Freq: Once | INTRAVENOUS | Status: DC

## 2021-03-18 NOTE — Patient Instructions (Signed)
Thank you for allowing [place] to care for you today! Await pathology results of polyps removed, approximately 2 weeks.  Will make recommendation at that time for future colonoscopy. Resume previous diet and medications today, return to normal activities tomorrow, 03/19/21.   YOU HAD AN ENDOSCOPIC PROCEDURE TODAY AT [facility]:   Refer to the procedure report that was given to you for any specific questions about what was found during the examination.  If the procedure report does not answer your questions, please call your gastroenterologist to clarify.  If you requested that your care partner not be given the details of your procedure findings, then the procedure report has been included in a sealed envelope for you to review at your convenience later.  YOU SHOULD EXPECT: Some feelings of bloating in the abdomen. Passage of more gas than usual.  Walking can help get rid of the air that was put into your GI tract during the procedure and reduce the bloating. If you had a lower endoscopy (such as a colonoscopy or flexible sigmoidoscopy) you may notice spotting of blood in your stool or on the toilet paper. If you underwent a bowel prep for your procedure, you may not have a normal bowel movement for a few days.  Please Note:  You might notice some irritation and congestion in your nose or some drainage.  This is from the oxygen used during your procedure.  There is no need for concern and it should clear up in a day or so.  SYMPTOMS TO REPORT IMMEDIATELY:  Following lower endoscopy (colonoscopy or flexible sigmoidoscopy):  Excessive amounts of blood in the stool  Significant tenderness or worsening of abdominal pains  Swelling of the abdomen that is new, acute  Fever of 100F or higher   For urgent or emergent issues, a gastroenterologist can be reached at any hour by calling [phone]. Do not use MyChart messaging for urgent concerns.    DIET:  We do recommend a small meal  at first, but then you may proceed to your regular diet.  Drink plenty of fluids but you should avoid alcoholic beverages for 24 hours.  ACTIVITY:  You should plan to take it easy for the rest of today and you should NOT DRIVE or use heavy machinery until tomorrow (because of the sedation medicines used during the test).    FOLLOW UP: Our staff will call the number listed on your records 48-72 hours following your procedure to check on you and address any questions or concerns that you may have regarding the information given to you following your procedure. If we do not reach you, we will leave a message.  We will attempt to reach you two times.  During this call, we will ask if you have developed any symptoms of COVID 19. If you develop any symptoms (ie: fever, flu-like symptoms, shortness of breath, cough etc.) before then, please call [phone].  If you test positive for Covid 19 in the 2 weeks post procedure, please call and report this information to [place].    If any biopsies were taken you will be contacted by phone or by letter within the next 1-3 weeks.  Please call us at [phone] if you have not heard about the biopsies in 3 weeks.    SIGNATURES/CONFIDENTIALITY: You and/or your care partner have signed paperwork which will be entered into your electronic medical record.  These signatures attest to the fact that that the information above on your After  Visit Summary has been reviewed and is understood.  Full responsibility of the confidentiality of this discharge information lies with you and/or your care-partner.

## 2021-03-18 NOTE — Progress Notes (Signed)
Called to room to assist during endoscopic procedure.  Patient ID and intended procedure confirmed with present staff. Received instructions for my participation in the procedure from the performing physician.  

## 2021-03-18 NOTE — Op Note (Signed)
[place] Patient Name: Elaine Casey Procedure Date: 03/18/2021 10:01 AM MRN: [id number] Endoscopist: Sonny Masters "Elaine Casey ,  Age: 46 Referring MD:  Date of Birth: 1975-12-12 Gender: Female Account #: [id number] Procedure:                Colonoscopy Indications:              Screening for colorectal malignant neoplasm, This                            is the patient's first colonoscopy Medicines:                Monitored Anesthesia Care Procedure:                Pre-Anesthesia Assessment:                           - Prior to the procedure, a History and Physical                            was performed, and patient medications and                            allergies were reviewed. The patient's tolerance of                            previous anesthesia was also reviewed. The risks                            and benefits of the procedure and the sedation                            options and risks were discussed with the patient.                            All questions were answered, and informed consent                            was obtained. Prior Anticoagulants: The patient has                            taken no previous anticoagulant or antiplatelet                            agents. ASA Grade Assessment: II - A patient with                            mild systemic disease. After reviewing the risks                            and benefits, the patient was deemed in                            satisfactory condition to undergo the procedure.  After obtaining informed consent, the colonoscope                            was passed under direct vision. Throughout the                            procedure, the patient's blood pressure, pulse, and                            oxygen saturations were monitored continuously. The                            CF [id number] #[id number] was introduced through the anus                            and advanced to the  the terminal ileum. The                            colonoscopy was performed without difficulty. The                            patient tolerated the procedure well. The quality                            of the bowel preparation was good. The terminal                            ileum, ileocecal valve, appendiceal orifice, and                            rectum were photographed. Scope In: 10:16:09 AM Scope Out: 10:36:57 AM Scope Withdrawal Time: 0 hours 13 minutes 0 seconds  Total Procedure Duration: 0 hours 20 minutes 48 seconds  Findings:                 The terminal ileum appeared normal.                           Three sessile polyps were found in the sigmoid                            colon, descending colon and ascending colon. The                            polyps were 3 to 6 mm in size. These polyps were                            removed with a cold snare. Resection and retrieval                            were complete.                           Non-bleeding internal hemorrhoids were found during  retroflexion. Complications:            No immediate complications. Estimated Blood Loss:     Estimated blood loss was minimal. Impression:               - The examined portion of the ileum was normal.                           - Three 3 to 6 mm polyps in the sigmoid colon, in                            the descending colon and in the ascending colon,                            removed with a cold snare. Resected and retrieved.                           - Non-bleeding internal hemorrhoids. Recommendation:           - Discharge patient to home (with escort).                           - Await pathology results.                           - The findings and recommendations were discussed                            with the patient. Sonny Masters "Elaine Casey,  03/18/2021 10:47:29 AM

## 2021-03-18 NOTE — Progress Notes (Signed)
To pacu, VSS. Report to [email or web address] 

## 2021-03-18 NOTE — Progress Notes (Signed)
GASTROENTEROLOGY PROCEDURE H&P NOTE   Primary Care Physician: Leamon Arnt, MD    Reason for Procedure:   Colon cancer screening  Plan:    Colonoscopy  Patient is appropriate for endoscopic procedure(s) in the ambulatory ([place] [facility]) setting.  The nature of the procedure, as well as the risks, benefits, and alternatives were carefully and thoroughly reviewed with the patient. Ample time for discussion and questions allowed. The patient understood, was satisfied, and agreed to proceed.     HPI: Elaine Casey is a 46 y.o. female who presents for colonoscopy for colon cancer screening. Denies blood in stools, changes in bowel habits, weight loss. Positive fam history for colon cancer in grandmother in her 52s. This is her first colonoscopy  Past Medical History:  Diagnosis Date   Allergy    seasonal   Depression    past hx - controlled with meds   Heart murmur    age 30   Hyperlipidemia    slight- no meds   Sleep apnea    mild no cpap required    Past Surgical History:  Procedure Laterality Date   APPENDECTOMY  2021   LAPAROSCOPIC APPENDECTOMY N/A 12/18/2019   Procedure: APPENDECTOMY LAPAROSCOPIC;  Surgeon: Greer Pickerel, MD;  Location: [facility];  Service: General;  Laterality: N/A;    Prior to Admission medications   Medication Sig Start Date End Date Taking? Authorizing Provider  buPROPion (WELLBUTRIN XL) 150 MG 24 hr tablet Take 300 mg by mouth daily. 09/19/19  Yes [provider]  D3-50 1.25 MG (50000 UT) capsule Take 100,000 Units by mouth once a week. Friday 12/02/20  Yes [provider]  escitalopram (LEXAPRO) 20 MG tablet Take 20 mg by mouth daily.   Yes [provider]  lamoTRIgine (LAMICTAL) 150 MG tablet Take 150 mg by mouth daily.   Yes [provider]  norgestimate-ethinyl estradiol ([place] 28) 0.25-35 MG-MCG tablet Take 1 tablet by mouth daily. 01/13/21  Yes Leamon Arnt, MD  ondansetron (ZOFRAN) 4 MG tablet Take 1 tablet  (4 mg total) by mouth every 8 (eight) hours as needed for nausea or vomiting. 02/26/21  Yes Harris, Abigail, PA-C  ferrous sulfate 325 (65 FE) MG tablet Take 325 mg by mouth daily with breakfast.    [provider]  ibuprofen (ADVIL) 200 MG tablet Take 400-600 mg by mouth every 6 (six) hours as needed for headache.    [provider]  oxyCODONE (ROXICODONE) 5 MG immediate release tablet Take 0.5-1 tablets (2.5-5 mg total) by mouth every 6 (six) hours as needed for severe pain. Patient not taking: Reported on 03/04/2021 02/26/21   Margarita Mail, PA-C    Current Outpatient Medications  Medication Sig Dispense Refill   buPROPion (WELLBUTRIN XL) 150 MG 24 hr tablet Take 300 mg by mouth daily.     D3-50 1.25 MG (50000 UT) capsule Take 100,000 Units by mouth once a week. Friday     escitalopram (LEXAPRO) 20 MG tablet Take 20 mg by mouth daily.     lamoTRIgine (LAMICTAL) 150 MG tablet Take 150 mg by mouth daily.     norgestimate-ethinyl estradiol (SPRINTEC 28) 0.25-35 MG-MCG tablet Take 1 tablet by mouth daily. 84 tablet 3   ondansetron (ZOFRAN) 4 MG tablet Take 1 tablet (4 mg total) by mouth every 8 (eight) hours as needed for nausea or vomiting. 10 tablet 0   ferrous sulfate 325 (65 FE) MG tablet Take 325 mg by mouth daily with breakfast.  ibuprofen (ADVIL) 200 MG tablet Take 400-600 mg by mouth every 6 (six) hours as needed for headache.     oxyCODONE (ROXICODONE) 5 MG immediate release tablet Take 0.5-1 tablets (2.5-5 mg total) by mouth every 6 (six) hours as needed for severe pain. (Patient not taking: Reported on 03/04/2021) 8 tablet 0   Current Facility-Administered Medications  Medication Dose Route Frequency Provider Last Rate Last Admin   0.9 %  sodium chloride infusion  500 mL Intravenous Once Sharyn Creamer, MD        Allergies as of 03/18/2021 - Review Complete 03/18/2021  Allergen Reaction Noted   Oxycodone Itching 03/04/2021    Family History  Problem  Relation Age of Onset   Diabetes Mother    High Cholesterol Mother    High blood pressure Mother    COPD Father    Depression Father    Learning disabilities Father    Colon polyps Paternal Aunt    Cancer Maternal Grandmother    Diabetes Maternal Grandmother    Heart disease Maternal Grandmother    High Cholesterol Maternal Grandmother    Stroke Maternal Grandmother    High blood pressure Maternal Grandmother    Colon cancer Paternal Grandmother        dx'd in her 65's   Healthy Daughter    Healthy Daughter    Esophageal cancer Neg Hx    Rectal cancer Neg Hx    Stomach cancer Neg Hx     Social History   Socioeconomic History   Marital status: Married    Spouse name: Not on file   Number of children: 2   Years of education: Not on file   Highest education level: Not on file  Occupational History   Occupation: psychotherapist  Tobacco Use   Smoking status: Never   Smokeless tobacco: Never  Substance and Sexual Activity   Alcohol use: Never   Drug use: Never   Sexual activity: Yes    Birth control/protection: Pill  Other Topics Concern   Not on file  Social History Narrative   Originally from [place], [place]. Moved from [place] in 2017; prior to that had lived in [place] for about 6 years.   Husband is an Forensic psychologist.    2 daughters.    Social Determinants of Health   Financial Resource Strain: Not on file  Food Insecurity: Not on file  Transportation Needs: Not on file  Physical Activity: Not on file  Stress: Not on file  Social Connections: Not on file  Intimate Partner Violence: Not on file    Physical Exam: Vital signs in last 24 hours: BP 125/81    Pulse 89    Temp (!) 97.1 F (36.2 C)    Ht 5\' 5"  (1.651 m)    LMP 02/23/2021 (Exact Date)    SpO2 99%    BMI 34.11 kg/m  GEN: NAD EYE: Sclerae anicteric ENT: MMM CV: Non-tachycardic Pulm: No increased work of breathing GI: Soft, NT/ND NEURO:  Alert & Oriented   Christia Reading, MD [facility]  03/18/2021 10:12 AM

## 2021-03-18 NOTE — Progress Notes (Signed)
Pt's states no medical or surgical changes since previsit or office visit.   Cw vitals

## 2021-03-22 ENCOUNTER — Encounter: Payer: Self-pay | Admitting: Internal Medicine

## 2021-03-22 ENCOUNTER — Telehealth: Payer: Self-pay

## 2021-03-22 NOTE — Telephone Encounter (Signed)
Called # [phone] and left a message we tried to reach pt for a follow up call. maw

## 2021-03-22 NOTE — Telephone Encounter (Signed)
°  Follow up Call-  Call back number 03/18/2021  Post procedure Call Back phone  # [phone]  Permission to leave phone message Yes     Patient questions:  Do you have a fever, pain , or abdominal swelling? No. Pain Score  0 *  Have you tolerated food without any problems? Yes.    Have you been able to return to your normal activities? Yes.    Do you have any questions about your discharge instructions: Diet   No. Medications  No. Follow up visit  No.  Do you have questions or concerns about your Care? No.  Actions: * If pain score is 4 or above: No action needed, pain <4.

## 2021-04-01 ENCOUNTER — Encounter: Payer: Federal, State, Local not specified - PPO | Admitting: Family Medicine

## 2021-06-17 ENCOUNTER — Encounter: Payer: Self-pay | Admitting: Family Medicine

## 2021-07-21 DIAGNOSIS — F341 Dysthymic disorder: Secondary | ICD-10-CM | POA: Diagnosis not present

## 2021-07-30 DIAGNOSIS — F3342 Major depressive disorder, recurrent, in full remission: Secondary | ICD-10-CM | POA: Diagnosis not present

## 2021-07-30 DIAGNOSIS — F4322 Adjustment disorder with anxiety: Secondary | ICD-10-CM | POA: Diagnosis not present

## 2021-08-04 IMAGING — CT CT ABD-PELV W/ CM
2 of 5 series · 16 of 46 positions shown, 18 images · IV contrast (Omni 300)
Comparison: None.

CLINICAL DATA: Lower abdominal pain with leukocytosis.

EXAM:
CT ABDOMEN AND PELVIS WITH CONTRAST
TECHNIQUE: Multidetector CT imaging of the abdomen and pelvis was performed
using the standard protocol following bolus administration of
intravenous contrast.
CONTRAST:  80mL OMNIPAQUE IOHEXOL 300 MG/ML  SOLN

[Series 3: a/p w/ 5mm · axial · 0.89mm/px · z∈[+800,+1226]mm · 13 of 95 slices shown, 15 images]
[im 5/95  soft-tissue]
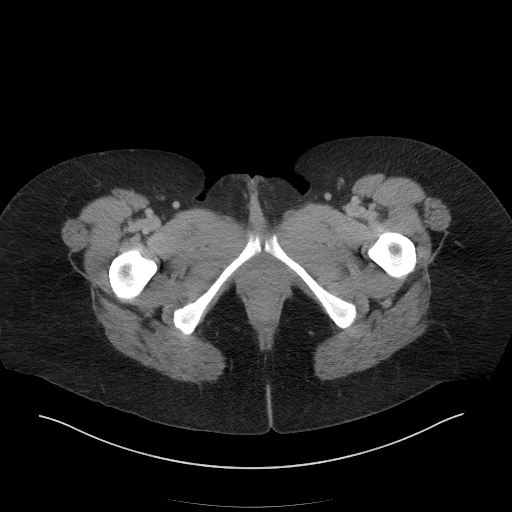
[im 5/95  bone]
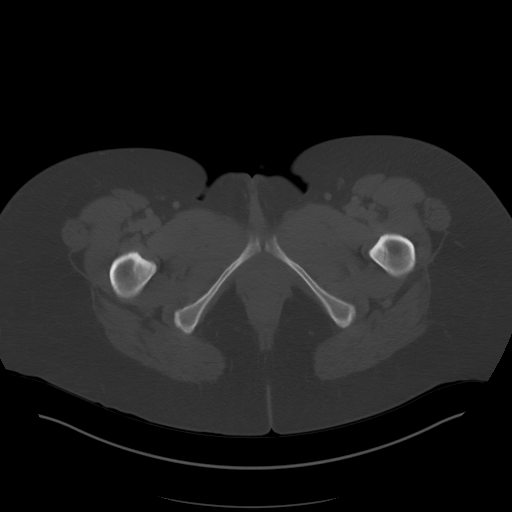
[im 15/95  soft-tissue]
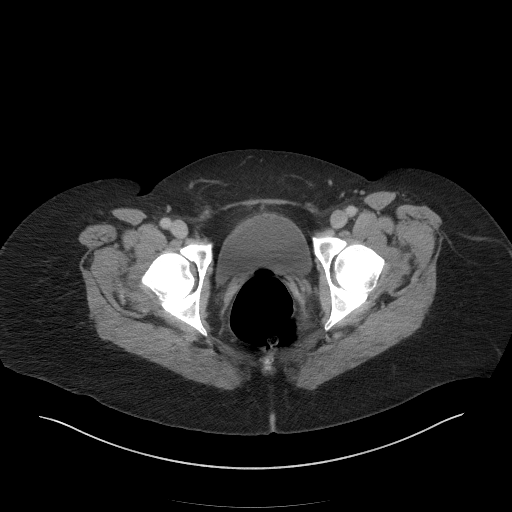
[im 20/95  soft-tissue]
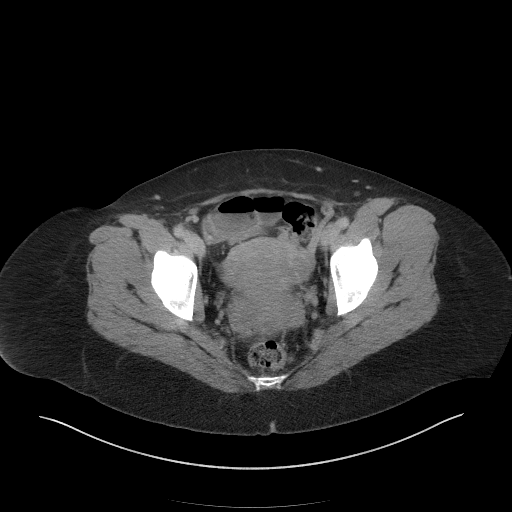
[im 25/95  soft-tissue]
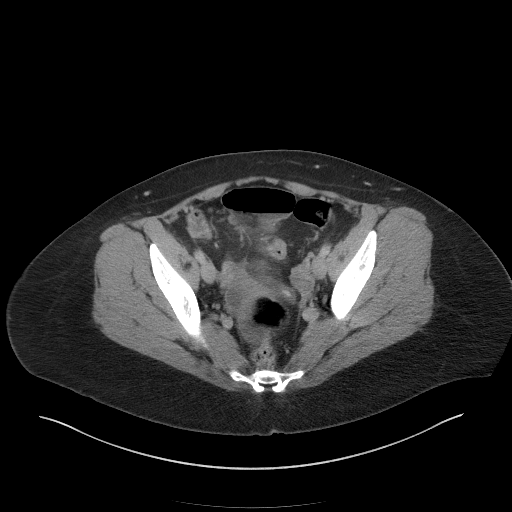
[im 35/95  soft-tissue]
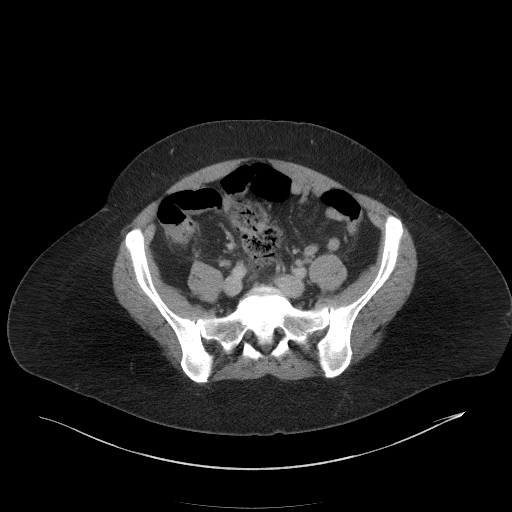
[im 40/95  soft-tissue]
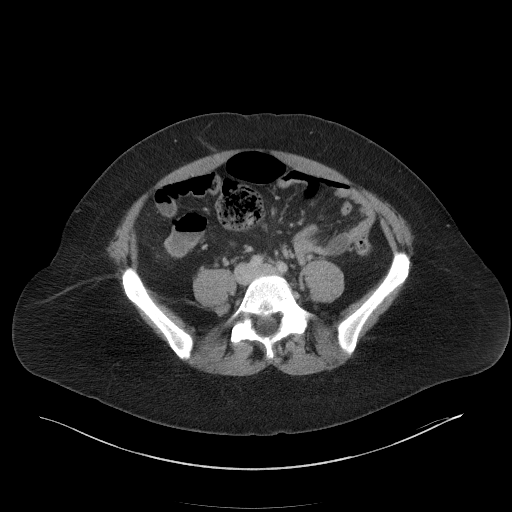
[im 50/95  soft-tissue]
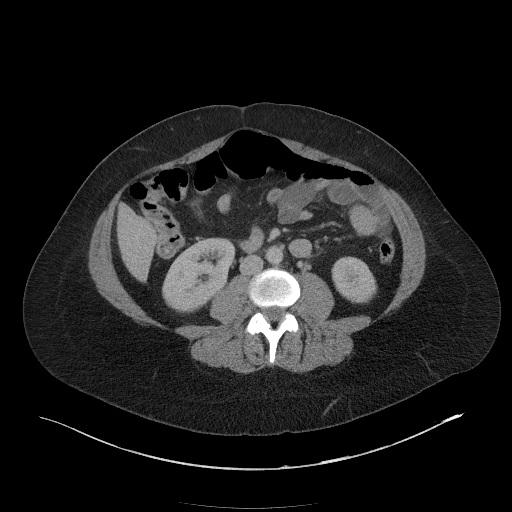
[im 55/95  soft-tissue]
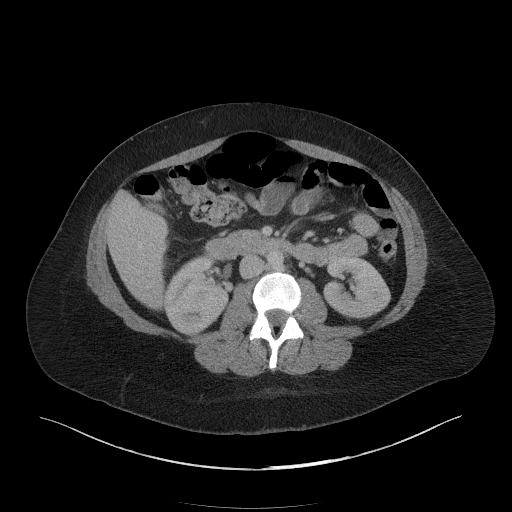
[im 60/95  soft-tissue]
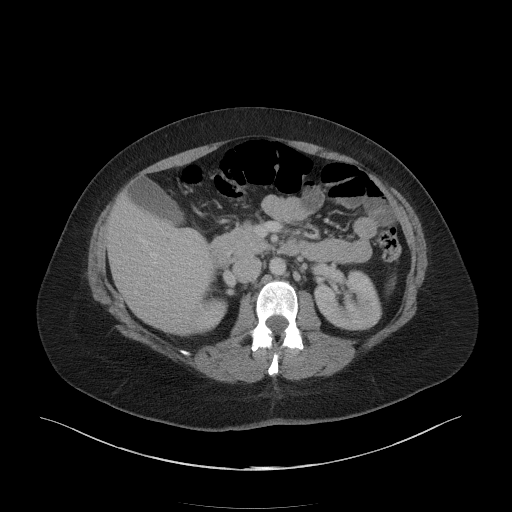
[im 60/95  bone]
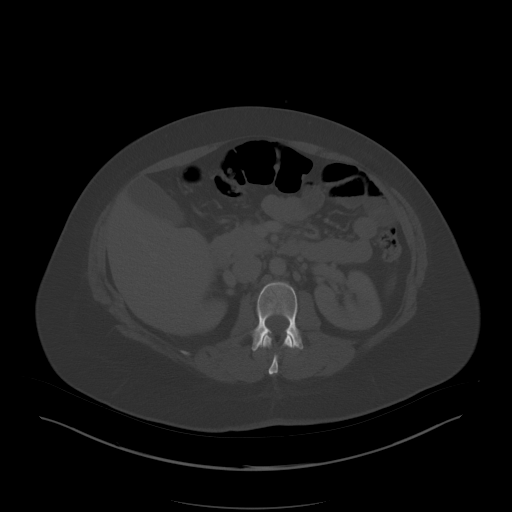
[im 70/95  soft-tissue]
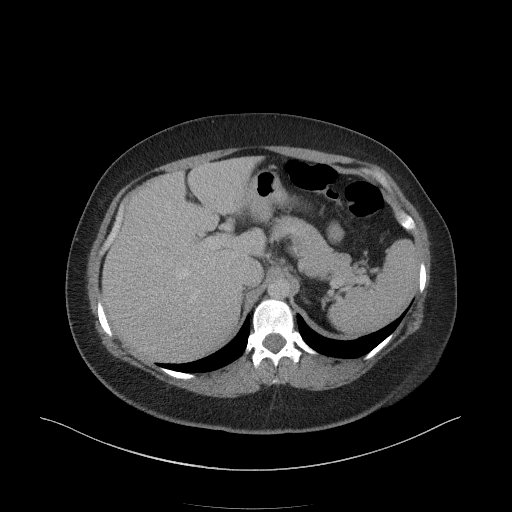
[im 75/95  soft-tissue]
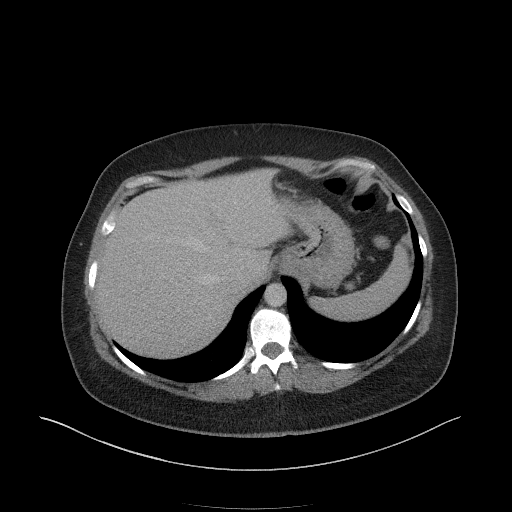
[im 80/95  soft-tissue]
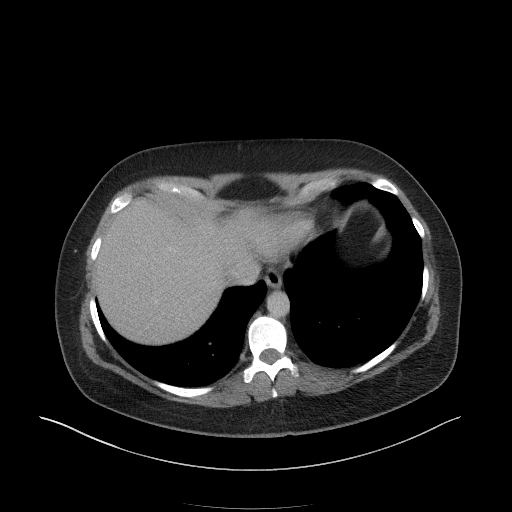
[im 90/95  soft-tissue]
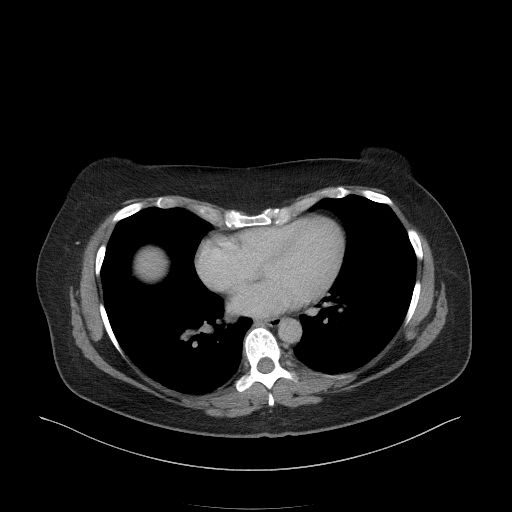

[Series 6: a/p w/ cor · coronal · 0.92mm/px · 3 of 162 slices shown]
[im 54/162  soft-tissue]
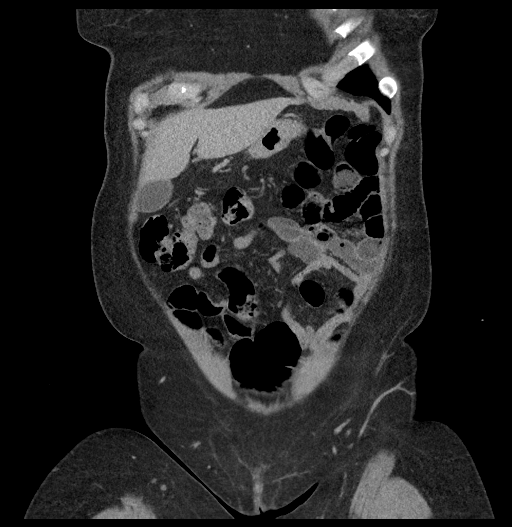
[im 72/162  soft-tissue]
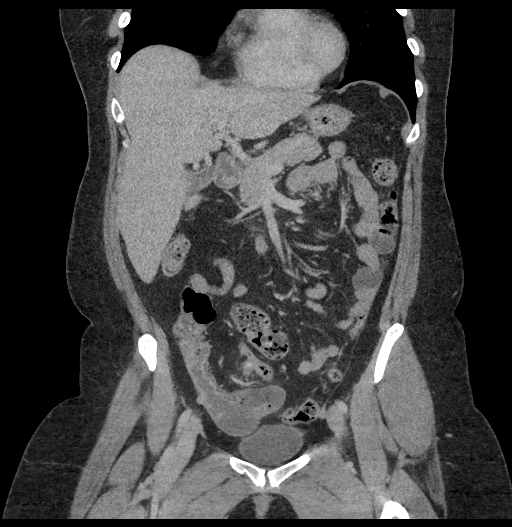
[im 90/162  soft-tissue]
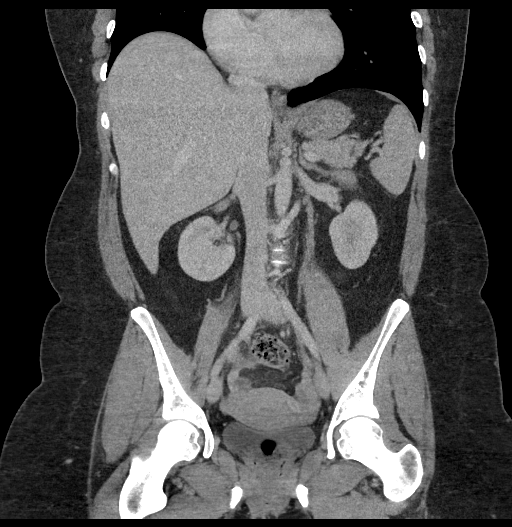

[16 of 46 positions shown; findings below may reference images not displayed]

FINDINGS: Lower chest: Unremarkable.

Hepatobiliary: No suspicious focal abnormality within the liver
parenchyma. There is no evidence for gallstones, gallbladder wall
thickening, or pericholecystic fluid. No intrahepatic or
extrahepatic biliary dilation.

Pancreas: No focal mass lesion. No dilatation of the main duct. No
intraparenchymal cyst. No peripancreatic edema.

Spleen: No splenomegaly. No focal mass lesion.

Adrenals/Urinary Tract: No adrenal nodule or mass. Kidneys
unremarkable. No evidence for hydroureter. The urinary bladder
appears normal for the degree of distention.

Stomach/Bowel: Stomach is unremarkable. No gastric wall thickening.
No evidence of outlet obstruction. Duodenum is normally positioned
as is the ligament of Treitz. No small bowel wall thickening. No
small bowel dilatation. The terminal ileum is normal. The appendix
is diffusely distended and fluid-filled measuring up to about 8 mm
diameter. 8 x 5 x 6 mm appendicoliths is noted at the base of the
appendix. There also appears to be a tiny 3 x 3 x 7 mm stone at the
tip of the appendix. 2.1 cm fluid collection is identified adjacent
to the appendix in the right adnexal space. Coronal imaging (105/6)
is more suggestive of focal periappendiceal fluid than right ovarian
follicle/cyst. No gross colonic mass. No colonic wall thickening.

Vascular/Lymphatic: No abdominal aortic aneurysm. No abdominal
aortic atherosclerotic calcification. There is no gastrohepatic or
hepatoduodenal ligament lymphadenopathy. No retroperitoneal or
mesenteric lymphadenopathy. No pelvic sidewall lymphadenopathy.

Reproductive: The uterus is unremarkable.  There is no adnexal mass.

Other: Trace free fluid noted in the cul-de-sac.

Musculoskeletal: No worrisome lytic or sclerotic osseous
abnormality.
IMPRESSION: 1. Diffusely distended and fluid-filled appendix measuring up to
about 8 mm diameter. 8 x 5 x 6 mm appendicoliths at the base of the
appendix with a 3 x 3 x 7 mm stone at the tip of the appendix.
Imaging features compatible with acute appendicitis.
2. 2.1 cm fluid collection raising the question of appendiceal
perforation is noted adjacent to the appendix in the right adnexal
space. Coronal imaging is more suggestive of focal periappendiceal
fluid than right ovarian follicle/cyst although the latter is a
possibility.
3. Trace free fluid in the cul-de-sac.

I personally called the results of this study to the patient's
provider, Stewart Cathcart, at the time of study interpretation.

## 2021-08-17 DIAGNOSIS — F341 Dysthymic disorder: Secondary | ICD-10-CM | POA: Diagnosis not present

## 2021-09-14 DIAGNOSIS — F341 Dysthymic disorder: Secondary | ICD-10-CM | POA: Diagnosis not present

## 2021-09-28 DIAGNOSIS — F341 Dysthymic disorder: Secondary | ICD-10-CM | POA: Diagnosis not present

## 2021-09-29 DIAGNOSIS — M79644 Pain in right finger(s): Secondary | ICD-10-CM | POA: Diagnosis not present

## 2021-10-04 ENCOUNTER — Encounter: Payer: Self-pay | Admitting: Family Medicine

## 2021-10-04 ENCOUNTER — Other Ambulatory Visit: Payer: Self-pay

## 2021-10-04 MED ORDER — NORGESTIMATE-ETH ESTRADIOL 0.25-35 MG-MCG PO TABS: 1.0000 | ORAL_TABLET | Freq: Every day | ORAL | 3 refills | Status: DC

## 2021-10-06 DIAGNOSIS — S62636D Displaced fracture of distal phalanx of right little finger, subsequent encounter for fracture with routine healing: Secondary | ICD-10-CM | POA: Diagnosis not present

## 2021-10-12 DIAGNOSIS — F341 Dysthymic disorder: Secondary | ICD-10-CM | POA: Diagnosis not present

## 2021-10-29 DIAGNOSIS — S62636D Displaced fracture of distal phalanx of right little finger, subsequent encounter for fracture with routine healing: Secondary | ICD-10-CM | POA: Diagnosis not present

## 2021-11-09 DIAGNOSIS — F341 Dysthymic disorder: Secondary | ICD-10-CM | POA: Diagnosis not present

## 2021-11-16 ENCOUNTER — Other Ambulatory Visit: Payer: Self-pay | Admitting: Family Medicine

## 2021-11-16 DIAGNOSIS — Z1231 Encounter for screening mammogram for malignant neoplasm of breast: Secondary | ICD-10-CM

## 2021-11-23 DIAGNOSIS — F341 Dysthymic disorder: Secondary | ICD-10-CM | POA: Diagnosis not present

## 2021-11-23 DIAGNOSIS — S62636D Displaced fracture of distal phalanx of right little finger, subsequent encounter for fracture with routine healing: Secondary | ICD-10-CM | POA: Diagnosis not present

## 2021-11-29 ENCOUNTER — Encounter: Payer: Self-pay | Admitting: *Deleted

## 2021-11-29 DIAGNOSIS — M25641 Stiffness of right hand, not elsewhere classified: Secondary | ICD-10-CM | POA: Diagnosis not present

## 2021-11-30 ENCOUNTER — Ambulatory Visit
Admission: RE | Admit: 2021-11-30 | Discharge: 2021-11-30 | Disposition: A | Discharge status: Home / Self Care | Payer: Federal, State, Local not specified - PPO | Source: Ambulatory Visit | Attending: Family Medicine | Admitting: Family Medicine

## 2021-11-30 DIAGNOSIS — Z1231 Encounter for screening mammogram for malignant neoplasm of breast: Secondary | ICD-10-CM | POA: Diagnosis not present

## 2021-12-07 DIAGNOSIS — M25641 Stiffness of right hand, not elsewhere classified: Secondary | ICD-10-CM | POA: Diagnosis not present

## 2021-12-07 DIAGNOSIS — F341 Dysthymic disorder: Secondary | ICD-10-CM | POA: Diagnosis not present

## 2021-12-13 DIAGNOSIS — M25641 Stiffness of right hand, not elsewhere classified: Secondary | ICD-10-CM | POA: Diagnosis not present

## 2021-12-21 DIAGNOSIS — F341 Dysthymic disorder: Secondary | ICD-10-CM | POA: Diagnosis not present

## 2021-12-21 DIAGNOSIS — H16223 Keratoconjunctivitis sicca, not specified as Sjogren's, bilateral: Secondary | ICD-10-CM | POA: Diagnosis not present

## 2021-12-23 DIAGNOSIS — S62636D Displaced fracture of distal phalanx of right little finger, subsequent encounter for fracture with routine healing: Secondary | ICD-10-CM | POA: Diagnosis not present

## 2022-01-04 DIAGNOSIS — F341 Dysthymic disorder: Secondary | ICD-10-CM | POA: Diagnosis not present

## 2022-01-12 ENCOUNTER — Encounter: Payer: Federal, State, Local not specified - PPO | Admitting: Family Medicine

## 2022-01-19 DIAGNOSIS — F3341 Major depressive disorder, recurrent, in partial remission: Secondary | ICD-10-CM | POA: Diagnosis not present

## 2022-02-01 DIAGNOSIS — F341 Dysthymic disorder: Secondary | ICD-10-CM | POA: Diagnosis not present

## 2022-02-08 DIAGNOSIS — F341 Dysthymic disorder: Secondary | ICD-10-CM | POA: Diagnosis not present

## 2022-02-17 ENCOUNTER — Encounter: Payer: Self-pay | Admitting: *Deleted

## 2022-02-23 DIAGNOSIS — F341 Dysthymic disorder: Secondary | ICD-10-CM | POA: Diagnosis not present

## 2022-03-02 DIAGNOSIS — F3341 Major depressive disorder, recurrent, in partial remission: Secondary | ICD-10-CM | POA: Diagnosis not present

## 2022-03-08 ENCOUNTER — Telehealth: Payer: Self-pay | Admitting: Family Medicine

## 2022-03-08 ENCOUNTER — Encounter: Payer: Self-pay | Admitting: Family Medicine

## 2022-03-08 DIAGNOSIS — K8020 Calculus of gallbladder without cholecystitis without obstruction: Secondary | ICD-10-CM

## 2022-03-08 NOTE — Telephone Encounter (Signed)
error 

## 2022-03-11 ENCOUNTER — Encounter: Payer: Self-pay | Admitting: Family Medicine

## 2022-03-11 ENCOUNTER — Ambulatory Visit: Payer: Federal, State, Local not specified - PPO | Admitting: Family Medicine

## 2022-03-11 VITALS — BP 108/72 | HR 81 | Temp 98.0°F | Ht 65.0 in | Wt 205.2 lb

## 2022-03-11 DIAGNOSIS — K8021 Calculus of gallbladder without cholecystitis with obstruction: Secondary | ICD-10-CM

## 2022-03-11 MED ORDER — TRAMADOL HCL 50 MG PO TABS
50.0000 mg | ORAL_TABLET | Freq: Three times a day (TID) | ORAL | 0 refills | Status: AC | PRN
Start: 2022-03-11 — End: 2022-03-16

## 2022-03-11 NOTE — Patient Instructions (Signed)
Please follow up as scheduled for your next visit with me: 05/11/2022   If you have any questions or concerns, please don't hesitate to send me a message via MyChart or call the office at [phone]. Thank you for visiting with [place] today! It's our pleasure caring for you.   We will get you scheduled with the surgeon.  Biliary Colic, Adult  Biliary colic is severe pain caused by a problem with the gallbladder. The gallbladder is a small organ in the upper right part of the abdomen. The gallbladder stores a digestive fluid produced in the liver (bile) that helps the body break down fat. Bile and other digestive enzymes are carried from the liver to the small intestine through tube-like structures called bile ducts. The gallbladder and the bile ducts form the biliary tract. Sometimes, hard deposits of digestive fluids (gallstones) form in the gallbladder and block the flow of bile from the gallbladder, causing biliary colic. This condition is also called a gallbladder attack. Gallstones can be as small as a grain of sand or as big as a golf ball. There could be just one gallstone in the gallbladder, or there could be many. What are the causes? This condition is usually caused by gallstones. Less often, a tumor could block the flow of bile from the gallbladder and trigger biliary colic. What increases the risk? The following factors may make you more likely to develop this condition: Being female. Having a family history of gallstones. Being obese. Losing weight suddenly or quickly. Eating a diet that is high in calories, low in fiber, and rich in refined carbohydrates, such as white bread and white rice. Having certain health conditions, such as: An intestinal disease that affects nutrient absorption, such as Crohn's disease. A metabolic condition, such as diabetes or metabolic syndrome. Metabolic syndrome occurs when someone has high blood pressure, high cholesterol, and diabetes. A blood  condition, such as hemolytic anemia or sickle cell disease. What are the signs or symptoms? The main symptom of this condition is severe pain in the upper right side of the abdomen. You may feel this pain below the chest but above the hip. This pain often occurs at night or after eating a meal that is high in fat. This pain may get worse for up to an hour and last as long as 12 hours. In most cases, the pain fades (subsides) within 2 hours. Other symptoms of this condition include: Nausea and vomiting. Pain under the right shoulder. How is this diagnosed? This condition is diagnosed based on your medical history, your symptoms, and a physical exam. You may also have tests, including: Blood tests to rule out infection or inflammation of the bile ducts, gallbladder, pancreas, or liver. Imaging studies, such as: An ultrasound. A CT scan. An MRI. In some cases, you may need to have an imaging study done using a small amount of radioactive material (nuclear medicine) to confirm the diagnosis. How is this treated? This condition may be treated with medicines to: Relieve your pain or nausea. Dissolve the gallstones. It may take months or years before the gallstones are completely gone. If you have gallstones, or if you have a tumor in the gallbladder that is causing biliary colic, you may need surgery to remove the gallbladder (cholecystectomy). Follow these instructions at home: Eating and drinking Drink enough fluid to keep your urine pale yellow. Follow instructions from your health care provider about eating or drinking restrictions. These may include avoiding: Fatty, greasy, and fried foods.  Any foods that make the pain worse. Overeating. Having a large meal after not eating for a while. General instructions Take over-the-counter and prescription medicines only as told by your health care provider. Keep all follow-up visits as told by your health care provider. This is important. How is  this prevented? Steps to prevent this condition include: Maintaining a healthy body weight. Getting regular exercise. Eating a healthy diet that is high in fiber and low in fat. Limiting how much sugar and refined carbohydrates you eat. Contact a health care provider if: Your pain lasts more than 5 hours. You vomit. You have a fever and chills. Your pain gets worse. Get help right away if: Your skin or the whites of your eyes look yellow (jaundice). Your have tea-colored urine and light-colored stools (feces). You are dizzy or you faint. Summary Biliary colic is severe pain caused by a problem with the gallbladder. The gallbladder is a small organ in the upper right part of your abdomen. Treatment for this condition may include medicine to relieve your pain or nausea, or medicine to slowly dissolve the gallstones. If you have gallstones, or if you have a tumor in the gallbladder that is causing biliary colic, you may need surgery to remove the gallbladder (cholecystectomy). This information is not intended to replace advice given to you by your health care provider. Make sure you discuss any questions you have with your health care provider. Document Revised: 03/05/2019 Document Reviewed: 12/25/2018 Elsevier Patient Education  [place].

## 2022-03-11 NOTE — Progress Notes (Unsigned)
Subjective  CC:  Chief Complaint  Patient presents with   Referral    Pt needs a referral placed to have gallbladder removed    HPI: Elaine Casey is a 47 y.o. female who presents to the office today to address the problems listed above in the chief complaint. Gallstones: dec 3557: biliary colic attack evaluated in EDP and ultrasound confirmed gallstones. Did well until recenlty: now with 2 moderate to severe biliary colic attacks over last 2 weeks. No f/c/s, n/v but required pain medications to help alleviate the pain. Pain free now but remains mildly sore in the RUQ. No epigastric or LUQ pain. Normal bowel movements.    Assessment  1. Calculus of gallbladder with biliary obstruction but without cholecystitis      Plan  Symptomatic gallstones:  education, tramadol to be used if needed and refer to general surgery for cholecystectomy. Reviewed signs of infection and instructions given. Pt declines lab work today (CBC, CMP, lipase)  Follow up: for cpe  05/11/2022  Orders Placed This Encounter  Procedures   Ambulatory referral to General Surgery   Meds ordered this encounter  Medications   traMADol (ULTRAM) 50 MG tablet    Sig: Take 1 tablet (50 mg total) by mouth every 8 (eight) hours as needed for up to 5 days.    Dispense:  15 tablet    Refill:  0      I reviewed the patients updated PMH, FH, and SocHx.    Patient Active Problem List   Diagnosis Date Noted   Oral contraceptive use 01/11/2021   Major depression, recurrent, chronic ([place]) 01/11/2021   OSA (obstructive sleep apnea) - mild, no CPAP 09/02/2019   Current Meds  Medication Sig   D3-50 1.25 MG (50000 UT) capsule Take 100,000 Units by mouth once a week. Friday   ferrous sulfate 325 (65 FE) MG tablet Take 325 mg by mouth daily with breakfast.   ibuprofen (ADVIL) 200 MG tablet Take 400-600 mg by mouth every 6 (six) hours as needed for headache.   norgestimate-ethinyl estradiol (SPRINTEC 28) 0.25-35 MG-MCG  tablet Take 1 tablet by mouth daily.   traMADol (ULTRAM) 50 MG tablet Take 1 tablet (50 mg total) by mouth every 8 (eight) hours as needed for up to 5 days.    Allergies: Patient is allergic to oxycodone. Family History: Patient family history includes COPD in her father; Cancer in her maternal grandmother; Colon cancer in her paternal grandmother; Colon polyps in her paternal aunt; Depression in her father; Diabetes in her maternal grandmother and mother; Healthy in her daughter and daughter; Heart disease in her maternal grandmother; High Cholesterol in her maternal grandmother and mother; High blood pressure in her maternal grandmother and mother; Learning disabilities in her father; Stroke in her maternal grandmother. Social History:  Patient  reports that she has never smoked. She has never used smokeless tobacco. She reports that she does not drink alcohol and does not use drugs.  Review of Systems: Constitutional: Negative for fever malaise or anorexia Cardiovascular: negative for chest pain Respiratory: negative for SOB or persistent cough Gastrointestinal: + for abdominal pain  Objective  Vitals: BP 108/72   Pulse 81   Temp 98 F (36.7 C)   Ht '5\' 5"'$  (1.651 m)   Wt 205 lb 3.2 oz (93.1 kg)   SpO2 98%   BMI 34.15 kg/m  General: no acute distress , A&Ox3 HEENT: PEERL, conjunctiva normal, neck is supple Cardiovascular:  RRR without murmur or  gallop.  Respiratory:  Good breath sounds bilaterally, CTAB with normal respiratory effort Abd: benign abdomen, no RUQ ttp or hepatomegaly, no LUQ ttp, nl bs     Commons side effects, risks, benefits, and alternatives for medications and treatment plan prescribed today were discussed, and the patient expressed understanding of the given instructions. Patient is instructed to call or message via MyChart if he/she has any questions or concerns regarding our treatment plan. No barriers to understanding were identified. We discussed Red Flag  symptoms and signs in detail. Patient expressed understanding regarding what to do in case of urgent or emergency type symptoms.  Medication list was reconciled, printed and provided to the patient in AVS. Patient instructions and summary information was reviewed with the patient as documented in the AVS. This note was prepared with assistance of Dragon voice recognition software. Occasional wrong-word or sound-a-like substitutions may have occurred due to the inherent limitations of voice recognition software  This visit occurred during the SARS-CoV-2 public health emergency.  Safety protocols were in place, including screening questions prior to the visit, additional usage of staff PPE, and extensive cleaning of exam room while observing appropriate contact time as indicated for disinfecting solutions.

## 2022-03-15 DIAGNOSIS — F341 Dysthymic disorder: Secondary | ICD-10-CM | POA: Diagnosis not present

## 2022-03-16 ENCOUNTER — Encounter: Payer: Self-pay | Admitting: Family Medicine

## 2022-03-22 DIAGNOSIS — K8010 Calculus of gallbladder with chronic cholecystitis without obstruction: Secondary | ICD-10-CM | POA: Diagnosis not present

## 2022-04-12 DIAGNOSIS — F341 Dysthymic disorder: Secondary | ICD-10-CM | POA: Diagnosis not present

## 2022-04-22 ENCOUNTER — Other Ambulatory Visit: Payer: Self-pay | Admitting: General Surgery

## 2022-04-22 DIAGNOSIS — K8010 Calculus of gallbladder with chronic cholecystitis without obstruction: Secondary | ICD-10-CM | POA: Diagnosis not present

## 2022-04-22 DIAGNOSIS — K812 Acute cholecystitis with chronic cholecystitis: Secondary | ICD-10-CM | POA: Diagnosis not present

## 2022-05-11 ENCOUNTER — Encounter: Payer: Self-pay | Admitting: Family Medicine

## 2022-05-11 ENCOUNTER — Ambulatory Visit (INDEPENDENT_AMBULATORY_CARE_PROVIDER_SITE_OTHER): Payer: Federal, State, Local not specified - PPO | Admitting: Family Medicine

## 2022-05-11 VITALS — BP 114/80 | HR 91 | Temp 97.8°F | Ht 65.0 in | Wt 202.8 lb

## 2022-05-11 DIAGNOSIS — Z0000 Encounter for general adult medical examination without abnormal findings: Secondary | ICD-10-CM

## 2022-05-11 DIAGNOSIS — K8021 Calculus of gallbladder without cholecystitis with obstruction: Secondary | ICD-10-CM | POA: Diagnosis not present

## 2022-05-11 DIAGNOSIS — Z3041 Encounter for surveillance of contraceptive pills: Secondary | ICD-10-CM

## 2022-05-11 DIAGNOSIS — F339 Major depressive disorder, recurrent, unspecified: Secondary | ICD-10-CM | POA: Diagnosis not present

## 2022-05-11 HISTORY — DX: Calculus of gallbladder without cholecystitis with obstruction: K80.21

## 2022-05-11 LAB — COMPREHENSIVE METABOLIC PANEL
ALT: 10 U/L (ref 0–35)
AST: 14 U/L (ref 0–37)
Albumin: 4.1 g/dL (ref 3.5–5.2)
Alkaline Phosphatase: 68 U/L (ref 39–117)
BUN: 10 mg/dL (ref 6–23)
CO2: 22 mEq/L (ref 19–32)
Calcium: 9.6 mg/dL (ref 8.4–10.5)
Chloride: 100 mEq/L (ref 96–112)
Creatinine, Ser: 0.76 mg/dL (ref 0.40–1.20)
GFR: 93.61 mL/min (ref >60.00)
Glucose, Bld: 80 mg/dL (ref 70–99)
Potassium: 4.3 mEq/L (ref 3.5–5.1)
Sodium: 134 mEq/L — ABNORMAL LOW (ref 135–145)
Total Bilirubin: 0.3 mg/dL (ref 0.2–1.2)
Total Protein: 7.3 g/dL (ref 6.0–8.3)

## 2022-05-11 LAB — CBC WITH DIFFERENTIAL/PLATELET
Basophils Absolute: 0.1 10*3/uL (ref 0.0–0.1)
Basophils Relative: 0.8 % (ref 0.0–3.0)
Eosinophils Absolute: 0.1 10*3/uL (ref 0.0–0.7)
Eosinophils Relative: 1.5 % (ref 0.0–5.0)
HCT: 40 % (ref 36.0–46.0)
Hemoglobin: 13.1 g/dL (ref 12.0–15.0)
Lymphocytes Relative: 25.7 % (ref 12.0–46.0)
Lymphs Abs: 2.5 10*3/uL (ref 0.7–4.0)
MCHC: 32.9 g/dL (ref 30.0–36.0)
MCV: 79.9 fl (ref 78.0–100.0)
Monocytes Absolute: 0.4 10*3/uL (ref 0.1–1.0)
Monocytes Relative: 4.4 % (ref 3.0–12.0)
Neutro Abs: 6.6 10*3/uL (ref 1.4–7.7)
Neutrophils Relative %: 67.6 % (ref 43.0–77.0)
Platelets: 387 10*3/uL (ref 150.0–400.0)
RBC: 5 Mil/uL (ref 3.87–5.11)
RDW: 13.9 % (ref 11.5–15.5)
WBC: 9.7 10*3/uL (ref 4.0–10.5)

## 2022-05-11 LAB — LIPID PANEL
Cholesterol: 250 mg/dL — ABNORMAL HIGH (ref 0–200)
HDL: 71.4 mg/dL (ref >39.00)
NonHDL: 178.56
Total CHOL/HDL Ratio: 4
Triglycerides: 232 mg/dL — ABNORMAL HIGH (ref 0.0–149.0)
VLDL: 46.4 mg/dL — ABNORMAL HIGH (ref 0.0–40.0)

## 2022-05-11 LAB — LDL CHOLESTEROL, DIRECT: Direct LDL: 155 mg/dL

## 2022-05-11 NOTE — Patient Instructions (Signed)
Please return in 12 months for your annual complete physical; please come fasting.   I will release your lab results to you on your MyChart account with further instructions. You may see the results before I do, but when I review them I will send you a message with my report or have my assistant call you if things need to be discussed. Please reply to my message with any questions. Thank you!   If you have any questions or concerns, please don't hesitate to send me a message via MyChart or call the office at [phone]. Thank you for visiting with [place] today! It's our pleasure caring for you.   Please do these things to maintain good health!  Exercise at least 30-45 minutes a day,  4-5 days a week.  Eat a low-fat diet with lots of fruits and vegetables, up to 7-9 servings per day. Drink plenty of water daily. Try to drink 8 8oz glasses per day. Seatbelts can save your life. Always wear your seatbelt. Place Smoke Detectors on every level of your home and check batteries every year. Schedule an appointment with an eye doctor for an eye exam every 1-2 years Safe sex - use condoms to protect yourself from STDs if you could be exposed to these types of infections. Use birth control if you do not want to become pregnant and are sexually active. Avoid heavy alcohol use. If you drink, keep it to less than 2 drinks/day and not every day. [place].  Choose someone you trust that could speak for you if you became unable to speak for yourself. Depression is common in our stressful world.If you're feeling down or losing interest in things you normally enjoy, please come in for a visit. If anyone is threatening or hurting you, please get help. Physical or Emotional Violence is never OK.

## 2022-05-11 NOTE — Progress Notes (Signed)
Subjective  Chief Complaint  Patient presents with   Annual Exam    Pt here for Annual Exam and is not currently fasting     HPI: Elaine Casey is a 47 y.o. female who presents to [facility] today for a Female Wellness Visit. She also has the concerns and/or needs as listed above in the chief complaint. These will be addressed in addition to the Health Maintenance Visit.   Wellness Visit: annual visit with health maintenance review and exam without Pap  HM: screens are current. Declines flu vaccine.  Chronic disease f/u and/or acute problem visit: (deemed necessary to be done in addition to the wellness visit): S/p cholecystectomy and doing well.reviwed path report: chronic choleycystitis. Depression well controlled and managed by Dr. Toy Care.  Assessment  1. Annual physical exam   2. Major depression, recurrent, chronic ([place])   3. Oral contraceptive use   4. Calculus of gallbladder with biliary obstruction but without cholecystitis      Plan  Female Wellness Visit: Age appropriate Health Maintenance and Prevention measures were discussed with patient. Included topics are cancer screening recommendations, ways to keep healthy (see AVS) including dietary and exercise recommendations, regular eye and dental care, use of seat belts, and avoidance of moderate alcohol use and tobacco use.  BMI: discussed patient's BMI and encouraged positive lifestyle modifications to help get to or maintain a target BMI. HM needs and immunizations were addressed and ordered. See below for orders. See HM and immunization section for updates. Routine labs and screening tests ordered including cmp, cbc and lipids where appropriate. Discussed recommendations regarding Vit D and calcium supplementation (see AVS)  Chronic disease management visit and/or acute problem visit: Controlled depression   Follow up: 12 mo for cpe  Orders Placed This Encounter  Procedures   CBC with  Differential/Platelet   Comprehensive metabolic panel   Lipid panel   No orders of the defined types were placed in this encounter.     Body mass index is 33.75 kg/m. Wt Readings from Last 3 Encounters:  05/11/22 202 lb 12.8 oz (92 kg)  03/11/22 205 lb 3.2 oz (93.1 kg)  03/04/21 205 lb (93 kg)     Patient Active Problem List   Diagnosis Date Noted   Calculus of gallbladder with biliary obstruction but without cholecystitis 05/11/2022   Oral contraceptive use 01/11/2021   Major depression, recurrent, chronic ([place]) 01/11/2021   OSA (obstructive sleep apnea) - mild, no CPAP 09/02/2019   Health Maintenance  Topic Date Due   COVID-19 Vaccine (4 - 2023-24 season) 05/27/2022 (Originally 11/05/2021)   INFLUENZA VACCINE  06/20/2022 (Originally 10/05/2021)   COLONOSCOPY (Pts 45-80yr Insurance coverage will need to be confirmed)  03/18/2024   PAP SMEAR-Modifier  04/01/2024   DTaP/Tdap/Td (2 - Td or Tdap) 04/01/2029   Hepatitis C Screening  Completed   HIV Screening  Completed   HPV VACCINES  Aged Out   Immunization History  Administered Date(s) Administered   Influenza,inj,Quad PF,6+ Mos 01/04/2019, 12/02/2019   Influenza-Unspecified 12/29/2020   PFIZER(Purple Top)SARS-COV-2 Vaccination 04/24/2019, 05/15/2019   Pfizer Covid-19 Vaccine Bivalent Booster 150yr& up 12/17/2020   Tdap 04/02/2019   We updated and reviewed the patient's past history in detail and it is documented below. Allergies: Patient is allergic to oxycodone. Past Medical History Patient  has a past medical history of Allergy, Depression, Heart murmur, Hyperlipidemia, and Sleep apnea. Past Surgical History Patient  has a past surgical history that includes  laparoscopic appendectomy (N/A, 12/18/2019) and Appendectomy (2021). Family History: Patient family history includes COPD in her father; Cancer in her maternal grandmother; Colon cancer in her paternal grandmother; Colon polyps in her paternal aunt; Depression in  her father; Diabetes in her maternal grandmother and mother; Healthy in her daughter and daughter; Heart disease in her maternal grandmother; High Cholesterol in her maternal grandmother and mother; High blood pressure in her maternal grandmother and mother; Learning disabilities in her father; Stroke in her maternal grandmother. Social History:  Patient  reports that she has never smoked. She has never used smokeless tobacco. She reports that she does not drink alcohol and does not use drugs.  Review of Systems: Constitutional: negative for fever or malaise Ophthalmic: negative for photophobia, double vision or loss of vision Cardiovascular: negative for chest pain, dyspnea on exertion, or new LE swelling Respiratory: negative for SOB or persistent cough Gastrointestinal: negative for abdominal pain, change in bowel habits or melena Genitourinary: negative for dysuria or gross hematuria, no abnormal uterine bleeding or disharge Musculoskeletal: negative for new gait disturbance or muscular weakness Integumentary: negative for new or persistent rashes, no breast lumps Neurological: negative for TIA or stroke symptoms Psychiatric: negative for SI or delusions Allergic/Immunologic: negative for hives  Patient Care Team    Relationship Specialty Notifications Start End  Leamon Arnt, MD PCP - General Family Medicine  04/02/19   Chucky May, MD Consulting Physician Psychiatry  04/02/19     Objective  Vitals: BP 114/80   Pulse 91   Temp 97.8 F (36.6 C)   Ht '5\' 5"'$  (1.651 m)   Wt 202 lb 12.8 oz (92 kg)   SpO2 99%   BMI 33.75 kg/m  General:  Well developed, well nourished, no acute distress  Psych:  Alert and orientedx3,normal mood and affect HEENT:  Normocephalic, atraumatic, non-icteric sclera,  supple neck without adenopathy, mass or thyromegaly Cardiovascular:  Normal S1, S2, RRR without gallop, rub or murmur Respiratory:  Good breath sounds bilaterally, CTAB with normal  respiratory effort Gastrointestinal: normal bowel sounds, soft, non-tender, no noted masses. No HSM, well healing lap chole incisions MSK: no deformities, contusions. Joints are without erythema or swelling.  Skin:  Warm, no rashes or suspicious lesions noted   Commons side effects, risks, benefits, and alternatives for medications and treatment plan prescribed today were discussed, and the patient expressed understanding of the given instructions. Patient is instructed to call or message via MyChart if he/she has any questions or concerns regarding our treatment plan. No barriers to understanding were identified. We discussed Red Flag symptoms and signs in detail. Patient expressed understanding regarding what to do in case of urgent or emergency type symptoms.  Medication list was reconciled, printed and provided to the patient in AVS. Patient instructions and summary information was reviewed with the patient as documented in the AVS. This note was prepared with assistance of Dragon voice recognition software. Occasional wrong-word or sound-a-like substitutions may have occurred due to the inherent limitations of voice recognition software

## 2022-05-12 ENCOUNTER — Encounter: Payer: Self-pay | Admitting: Family Medicine

## 2022-06-02 DIAGNOSIS — F4320 Adjustment disorder, unspecified: Secondary | ICD-10-CM | POA: Diagnosis not present

## 2022-06-16 DIAGNOSIS — F4320 Adjustment disorder, unspecified: Secondary | ICD-10-CM | POA: Diagnosis not present

## 2022-06-30 DIAGNOSIS — F4320 Adjustment disorder, unspecified: Secondary | ICD-10-CM | POA: Diagnosis not present

## 2022-07-21 DIAGNOSIS — F4320 Adjustment disorder, unspecified: Secondary | ICD-10-CM | POA: Diagnosis not present

## 2022-08-22 DIAGNOSIS — F3342 Major depressive disorder, recurrent, in full remission: Secondary | ICD-10-CM | POA: Diagnosis not present

## 2022-11-12 ENCOUNTER — Other Ambulatory Visit: Payer: Self-pay | Admitting: Family Medicine

## 2022-12-09 ENCOUNTER — Other Ambulatory Visit: Payer: Self-pay | Admitting: Family Medicine

## 2022-12-09 DIAGNOSIS — Z1231 Encounter for screening mammogram for malignant neoplasm of breast: Secondary | ICD-10-CM

## 2022-12-12 DIAGNOSIS — F4323 Adjustment disorder with mixed anxiety and depressed mood: Secondary | ICD-10-CM | POA: Diagnosis not present

## 2022-12-14 ENCOUNTER — Ambulatory Visit
Admission: RE | Admit: 2022-12-14 | Discharge: 2022-12-14 | Disposition: A | Discharge status: Home / Self Care | Payer: Federal, State, Local not specified - PPO | Source: Ambulatory Visit

## 2022-12-14 ENCOUNTER — Other Ambulatory Visit: Payer: Self-pay

## 2022-12-14 DIAGNOSIS — Z1231 Encounter for screening mammogram for malignant neoplasm of breast: Secondary | ICD-10-CM | POA: Diagnosis not present

## 2022-12-18 NOTE — Progress Notes (Unsigned)
Celso Amy, PA-C [address]  [address]  [place], [place] [address]  Main: [phone]  Fax: [phone]   Gastroenterology Consultation  Referring Provider:     Willow Ora, MD Primary Care Physician:  Willow Ora, MD Primary Gastroenterologist:  Celso Amy, PA-C  Reason for Consultation:     Diarrhea        HPI:   JAELYN BOURGOIN is a 47 y.o. y/o female referred for consultation & management  by Willow Ora, MD.    She underwent cholecystectomy for cholelithiasis with chronic cholecystitis 04/2022 by Dr. Alvan Dame.  Since then she has had increased diarrhea after every meal.  Patient states she has history of a nervous stomach with mild intermittent diarrhea and abdominal cramping for many years.  She states she has had increased worsening diarrhea since cholecystectomy.  She is having 3 liquid watery bowel movements daily after every meal.  Has some nausea but no vomiting.  Denies abdominal pain, rectal bleeding, or weight loss.    Screening colonoscopy done by Dr. Leonides Schanz 03/2021 at [facility] showed 3 small (3 mm to 6 mm) sessile serrated polyps removed from colon.  Nonbleeding internal hemorrhoids.  3-year repeat.  Labs 05/11/2022 showed normal CBC and CMP.  No recent stool studies.  Past Medical History:  Diagnosis Date   Allergy    seasonal   Depression    past hx - controlled with meds   Heart murmur    age 70   Hyperlipidemia    slight- no meds   Sleep apnea    mild no cpap required    Past Surgical History:  Procedure Laterality Date   APPENDECTOMY  2021   LAPAROSCOPIC APPENDECTOMY N/A 12/18/2019   Procedure: APPENDECTOMY LAPAROSCOPIC;  Surgeon: Gaynelle Adu, MD;  Location: [facility] OR;  Service: General;  Laterality: N/A;    Prior to Admission medications   Medication Sig Start Date End Date Taking? Authorizing Provider  AUVELITY 45-105 MG TBCR Take 1 tablet by mouth in the morning and at bedtime.    [provider]  D3-50 1.25 MG  (50000 UT) capsule Take 100,000 Units by mouth once a week. Friday 12/02/20   [provider]  escitalopram (LEXAPRO) 20 MG tablet Take 1 tablet by mouth daily.    [provider]  ferrous sulfate 325 (65 FE) MG tablet Take 325 mg by mouth daily with breakfast.    [provider]  lamoTRIgine (LAMICTAL) 150 MG tablet Take 150 mg by mouth daily.    [provider]  LORazepam (ATIVAN) 0.5 MG tablet     [provider]  norgestimate-ethinyl estradiol (ORTHO-CYCLEN) 0.25-35 MG-MCG tablet TAKE 1 TABLET BY MOUTH EVERY DAY 11/14/22   Willow Ora, MD    Family History  Problem Relation Age of Onset   Diabetes Mother    High Cholesterol Mother    High blood pressure Mother    COPD Father    Depression Father    Learning disabilities Father    Colon polyps Paternal Aunt    Cancer Maternal Grandmother    Diabetes Maternal Grandmother    Heart disease Maternal Grandmother    High Cholesterol Maternal Grandmother    Stroke Maternal Grandmother    High blood pressure Maternal Grandmother    Colon cancer Paternal Grandmother        dx'd in her 41's   Healthy Daughter    Healthy Daughter    Esophageal cancer Neg Hx  Rectal cancer Neg Hx    Stomach cancer Neg Hx      Social History   Tobacco Use   Smoking status: Never   Smokeless tobacco: Never  Substance Use Topics   Alcohol use: Never   Drug use: Never    Allergies as of 12/19/2022 - Review Complete 12/19/2022  Allergen Reaction Noted   Oxycodone Itching 03/04/2021    Review of Systems:    All systems reviewed and negative except where noted in HPI.   Physical Exam:  BP 117/82   Pulse 81   Temp 97.7 F (36.5 C)   Ht 5\' 5"  (1.651 m)   Wt 204 lb (92.5 kg)   BMI 33.95 kg/m  No LMP recorded.  General:   Alert,  Well-developed, well-nourished, pleasant and cooperative in NAD Lungs:  Respirations even and unlabored.  Clear throughout to auscultation.   No wheezes, crackles, or  rhonchi. No acute distress. Heart:  Regular rate and rhythm; no murmurs, clicks, rubs, or gallops. Abdomen:  Normal bowel sounds.  No bruits.  Soft, and non-distended without masses, hepatosplenomegaly or hernias noted.  No Tenderness.  No guarding or rebound tenderness.    Neurologic:  Alert and oriented x3;  grossly normal neurologically. Psych:  Alert and cooperative. Normal mood and affect.  Imaging Studies: MM 3D SCREENING MAMMOGRAM BILATERAL BREAST  Result Date: 12/16/2022 CLINICAL DATA:  Screening. EXAM: DIGITAL SCREENING BILATERAL MAMMOGRAM WITH TOMOSYNTHESIS AND CAD TECHNIQUE: Bilateral screening digital craniocaudal and mediolateral oblique mammograms were obtained. Bilateral screening digital breast tomosynthesis was performed. The images were evaluated with computer-aided detection. COMPARISON:  Previous exam(s). ACR Breast Density Category b: There are scattered areas of fibroglandular density. FINDINGS: There are no findings suspicious for malignancy. IMPRESSION: No mammographic evidence of malignancy. A result letter of this screening mammogram will be mailed directly to the patient. RECOMMENDATION: Screening mammogram in one year. (Code:SM-B-01Y) BI-RADS CATEGORY  1: Negative. Electronically Signed   By: Baird Lyons M.D.   On: 12/16/2022 13:47    Assessment and Plan:   MADAILEIN LONDO is a 47 y.o. y/o female has been referred for chronic worsening diarrhea.  Sounds like she has history of irritable bowel syndrome with diarrhea.  Diarrhea has worsened since cholecystectomy 04/2022, consistent with bile salt diarrhea postcholecystectomy.  I am ordering stool studies to rule out infection or IBD.  Also giving treatment with close follow-up.  Normal screening colonoscopy 03/2021.  Diarrhea: Differential includes IBS-D, bile salt diarrhea postcholecystectomy, and infectious diarrhea.  Less likely IBD or microscopic colitis.  Stool Studies: GI Pathogen Panal, C. Difficile Toxin PCR, Fecal  Calprotectin   Studies are negative, then I will prescribe cholestyramine powder, mix 1 packet in a drink once daily for bile salt diarrhea.  Recommend low-fat diet.  Follow up in 6 weeks.  Also follow-up based on stool results.  Celso Amy, PA-C

## 2022-12-19 ENCOUNTER — Encounter: Payer: Self-pay | Admitting: Physician Assistant

## 2022-12-19 ENCOUNTER — Ambulatory Visit: Payer: Federal, State, Local not specified - PPO | Admitting: Physician Assistant

## 2022-12-19 VITALS — BP 117/82 | HR 81 | Temp 97.7°F | Ht 65.0 in | Wt 204.0 lb

## 2022-12-19 DIAGNOSIS — F4323 Adjustment disorder with mixed anxiety and depressed mood: Secondary | ICD-10-CM | POA: Diagnosis not present

## 2022-12-19 DIAGNOSIS — K529 Noninfective gastroenteritis and colitis, unspecified: Secondary | ICD-10-CM | POA: Diagnosis not present

## 2022-12-19 DIAGNOSIS — R197 Diarrhea, unspecified: Secondary | ICD-10-CM

## 2022-12-20 ENCOUNTER — Telehealth: Payer: Self-pay

## 2022-12-20 NOTE — Telephone Encounter (Signed)
Patient spoke with [name] for directions on samples--    i [name], The directions for collection are unclear. I have three vials (one of which already contains red liquid) and a cup. The one with the red liquid says "refrigerate" while the other three say "frozen specimen." Under what circumstances would I need to refrigerate or freeze my samples? Can't I just take them to the lab drop-off within an hour or so of collecting? Please advise. Thanks, Programmer, applications.

## 2022-12-21 DIAGNOSIS — R197 Diarrhea, unspecified: Secondary | ICD-10-CM | POA: Diagnosis not present

## 2022-12-22 LAB — CLOSTRIDIUM DIFFICILE BY PCR: Toxigenic C. Difficile by PCR: NEGATIVE

## 2022-12-24 LAB — GI PROFILE, STOOL, PCR

## 2022-12-24 LAB — CALPROTECTIN, FECAL: Calprotectin, Fecal: 7 ug/g (ref 0–120)

## 2022-12-26 ENCOUNTER — Other Ambulatory Visit: Payer: Self-pay | Admitting: Physician Assistant

## 2022-12-26 DIAGNOSIS — F4323 Adjustment disorder with mixed anxiety and depressed mood: Secondary | ICD-10-CM | POA: Diagnosis not present

## 2022-12-26 DIAGNOSIS — K9089 Other intestinal malabsorption: Secondary | ICD-10-CM

## 2022-12-26 MED ORDER — CHOLESTYRAMINE 4 G PO PACK: 4.0000 g | PACK | Freq: Every day | ORAL | 5 refills | Status: DC

## 2022-12-27 ENCOUNTER — Telehealth: Payer: Self-pay

## 2022-12-27 NOTE — Telephone Encounter (Signed)
Left detailed message for patient to call office.    Patient has already collected stool sample.  Her stool studies were normal. 2.  Regarding rectal bleeding, we can order CBC labs to check her hemoglobin to make sure she is not anemic.  Her last colonoscopy 03/2021 showed 3 small polyps and moderate internal hemorrhoids.  I recommend treat possible internal hemorrhoid bleeding with prescription hydrocortisone suppository 25 Mg 1 into the rectum once daily at bedtime for 12 days, #12, 1 refill.   3.  If rectal bleeding persists, or if she is anemic, then we can schedule repeat colonoscopy. 4.  Keep follow-up appointment in 1 month as scheduled. 5.  If rectal bleeding becomes heavy or if she feels weak/lightheaded, then go to the ED. Celso Amy, PA-C       Previous Messages

## 2022-12-28 NOTE — Telephone Encounter (Signed)
   Spoke with patient and she is well now. No more bleeding. She is going to monitor for now and if it happens again she will let us know.Patient has already collected stool sample.  Her stool studies were normal.  2.  Regarding rectal bleeding, we can order CBC labs to check her hemoglobin to make sure she is not anemic.  Her last colonoscopy 03/2021 showed 3 small polyps and moderate internal hemorrhoids.  I recommend treat possible internal hemorrhoid bleeding with prescription hydrocortisone suppository 25 Mg 1 into the rectum once daily at bedtime for 12 days, #12, 1 refill.   3.  If rectal bleeding persists, or if she is anemic, then we can schedule repeat colonoscopy.  4.  Keep follow-up appointment in 1 month as scheduled.  5.  If rectal bleeding becomes heavy or if she feels weak/lightheaded, then go to the ED.  Celso Amy, PA-C

## 2023-01-05 DIAGNOSIS — F4323 Adjustment disorder with mixed anxiety and depressed mood: Secondary | ICD-10-CM | POA: Diagnosis not present

## 2023-01-09 DIAGNOSIS — F4323 Adjustment disorder with mixed anxiety and depressed mood: Secondary | ICD-10-CM | POA: Diagnosis not present

## 2023-01-10 DIAGNOSIS — F4323 Adjustment disorder with mixed anxiety and depressed mood: Secondary | ICD-10-CM | POA: Diagnosis not present

## 2023-01-11 DIAGNOSIS — F4323 Adjustment disorder with mixed anxiety and depressed mood: Secondary | ICD-10-CM | POA: Diagnosis not present

## 2023-01-16 DIAGNOSIS — F4323 Adjustment disorder with mixed anxiety and depressed mood: Secondary | ICD-10-CM | POA: Diagnosis not present

## 2023-01-17 DIAGNOSIS — F4323 Adjustment disorder with mixed anxiety and depressed mood: Secondary | ICD-10-CM | POA: Diagnosis not present

## 2023-01-24 DIAGNOSIS — F4323 Adjustment disorder with mixed anxiety and depressed mood: Secondary | ICD-10-CM | POA: Diagnosis not present

## 2023-01-25 DIAGNOSIS — F4323 Adjustment disorder with mixed anxiety and depressed mood: Secondary | ICD-10-CM | POA: Diagnosis not present

## 2023-01-30 ENCOUNTER — Ambulatory Visit: Payer: Federal, State, Local not specified - PPO | Admitting: Physician Assistant

## 2023-01-30 DIAGNOSIS — F4323 Adjustment disorder with mixed anxiety and depressed mood: Secondary | ICD-10-CM | POA: Diagnosis not present

## 2023-01-31 DIAGNOSIS — F4323 Adjustment disorder with mixed anxiety and depressed mood: Secondary | ICD-10-CM | POA: Diagnosis not present

## 2023-02-01 DIAGNOSIS — F4323 Adjustment disorder with mixed anxiety and depressed mood: Secondary | ICD-10-CM | POA: Diagnosis not present

## 2023-02-06 DIAGNOSIS — F4323 Adjustment disorder with mixed anxiety and depressed mood: Secondary | ICD-10-CM | POA: Diagnosis not present

## 2023-02-07 ENCOUNTER — Encounter: Payer: Self-pay | Admitting: Physician Assistant

## 2023-02-07 DIAGNOSIS — F4323 Adjustment disorder with mixed anxiety and depressed mood: Secondary | ICD-10-CM | POA: Diagnosis not present

## 2023-02-08 DIAGNOSIS — F4323 Adjustment disorder with mixed anxiety and depressed mood: Secondary | ICD-10-CM | POA: Diagnosis not present

## 2023-02-13 DIAGNOSIS — F4323 Adjustment disorder with mixed anxiety and depressed mood: Secondary | ICD-10-CM | POA: Diagnosis not present

## 2023-02-14 DIAGNOSIS — F4323 Adjustment disorder with mixed anxiety and depressed mood: Secondary | ICD-10-CM | POA: Diagnosis not present

## 2023-02-15 DIAGNOSIS — F4323 Adjustment disorder with mixed anxiety and depressed mood: Secondary | ICD-10-CM | POA: Diagnosis not present

## 2023-02-20 DIAGNOSIS — F4323 Adjustment disorder with mixed anxiety and depressed mood: Secondary | ICD-10-CM | POA: Diagnosis not present

## 2023-02-21 DIAGNOSIS — F4323 Adjustment disorder with mixed anxiety and depressed mood: Secondary | ICD-10-CM | POA: Diagnosis not present

## 2023-02-22 DIAGNOSIS — F4323 Adjustment disorder with mixed anxiety and depressed mood: Secondary | ICD-10-CM | POA: Diagnosis not present

## 2023-03-02 DIAGNOSIS — F4323 Adjustment disorder with mixed anxiety and depressed mood: Secondary | ICD-10-CM | POA: Diagnosis not present

## 2023-03-06 DIAGNOSIS — F4323 Adjustment disorder with mixed anxiety and depressed mood: Secondary | ICD-10-CM | POA: Diagnosis not present

## 2023-03-08 HISTORY — PX: CHOLECYSTECTOMY: SHX55

## 2023-03-13 DIAGNOSIS — F4323 Adjustment disorder with mixed anxiety and depressed mood: Secondary | ICD-10-CM | POA: Diagnosis not present

## 2023-03-14 DIAGNOSIS — F4323 Adjustment disorder with mixed anxiety and depressed mood: Secondary | ICD-10-CM | POA: Diagnosis not present

## 2023-03-15 DIAGNOSIS — F4323 Adjustment disorder with mixed anxiety and depressed mood: Secondary | ICD-10-CM | POA: Diagnosis not present

## 2023-03-20 DIAGNOSIS — F4323 Adjustment disorder with mixed anxiety and depressed mood: Secondary | ICD-10-CM | POA: Diagnosis not present

## 2023-03-22 DIAGNOSIS — F4323 Adjustment disorder with mixed anxiety and depressed mood: Secondary | ICD-10-CM | POA: Diagnosis not present

## 2023-03-27 DIAGNOSIS — F4323 Adjustment disorder with mixed anxiety and depressed mood: Secondary | ICD-10-CM | POA: Diagnosis not present

## 2023-03-29 DIAGNOSIS — F4323 Adjustment disorder with mixed anxiety and depressed mood: Secondary | ICD-10-CM | POA: Diagnosis not present

## 2023-04-03 DIAGNOSIS — F4323 Adjustment disorder with mixed anxiety and depressed mood: Secondary | ICD-10-CM | POA: Diagnosis not present

## 2023-04-05 DIAGNOSIS — F4323 Adjustment disorder with mixed anxiety and depressed mood: Secondary | ICD-10-CM | POA: Diagnosis not present

## 2023-04-10 DIAGNOSIS — F4323 Adjustment disorder with mixed anxiety and depressed mood: Secondary | ICD-10-CM | POA: Diagnosis not present

## 2023-04-12 DIAGNOSIS — F4323 Adjustment disorder with mixed anxiety and depressed mood: Secondary | ICD-10-CM | POA: Diagnosis not present

## 2023-04-17 DIAGNOSIS — F4323 Adjustment disorder with mixed anxiety and depressed mood: Secondary | ICD-10-CM | POA: Diagnosis not present

## 2023-04-24 ENCOUNTER — Encounter: Payer: Self-pay | Admitting: Physician Assistant

## 2023-04-24 DIAGNOSIS — F4323 Adjustment disorder with mixed anxiety and depressed mood: Secondary | ICD-10-CM | POA: Diagnosis not present

## 2023-04-25 ENCOUNTER — Telehealth: Payer: Self-pay

## 2023-04-25 MED ORDER — COLESTIPOL HCL 1 G PO TABS: 1.0000 g | ORAL_TABLET | Freq: Every day | ORAL | 5 refills | Status: DC

## 2023-04-25 NOTE — Telephone Encounter (Signed)
Patient notified..  Yes.  Stop cholestyramine powder.  Start Rx Colestid (colestipol) 1 g, take 2 tablets once daily for diarrhea, #60, 5 refills.  We can increase or decrease dose based on if she is having diarrhea or constipation. Celso Amy, PA-C

## 2023-04-26 DIAGNOSIS — F4323 Adjustment disorder with mixed anxiety and depressed mood: Secondary | ICD-10-CM | POA: Diagnosis not present

## 2023-05-02 DIAGNOSIS — F4323 Adjustment disorder with mixed anxiety and depressed mood: Secondary | ICD-10-CM | POA: Diagnosis not present

## 2023-05-03 DIAGNOSIS — F4323 Adjustment disorder with mixed anxiety and depressed mood: Secondary | ICD-10-CM | POA: Diagnosis not present

## 2023-05-04 DIAGNOSIS — H16223 Keratoconjunctivitis sicca, not specified as Sjogren's, bilateral: Secondary | ICD-10-CM | POA: Diagnosis not present

## 2023-05-08 DIAGNOSIS — F4323 Adjustment disorder with mixed anxiety and depressed mood: Secondary | ICD-10-CM | POA: Diagnosis not present

## 2023-05-10 DIAGNOSIS — F4323 Adjustment disorder with mixed anxiety and depressed mood: Secondary | ICD-10-CM | POA: Diagnosis not present

## 2023-05-11 ENCOUNTER — Telehealth: Payer: Self-pay

## 2023-05-11 MED ORDER — COLESTIPOL HCL 1 G PO TABS: 1.0000 g | ORAL_TABLET | Freq: Two times a day (BID) | ORAL | 11 refills | Status: DC

## 2023-05-11 NOTE — Telephone Encounter (Signed)
 RX sent to pharmacy-message sent to patient.  Recommend patient increase Colestid (colestipol) to 2 tablets twice daily (2 every morning and 2 every evening).  If this causes constipation, then she can take 2 in the morning and 1 in the evening.  Okay to send new prescription for 2 tablets twice daily, #120, 11 refills.  Celso Amy, PA-C

## 2023-05-15 DIAGNOSIS — F4323 Adjustment disorder with mixed anxiety and depressed mood: Secondary | ICD-10-CM | POA: Diagnosis not present

## 2023-05-16 ENCOUNTER — Ambulatory Visit (INDEPENDENT_AMBULATORY_CARE_PROVIDER_SITE_OTHER): Payer: Federal, State, Local not specified - PPO | Admitting: Family Medicine

## 2023-05-16 ENCOUNTER — Encounter: Payer: Self-pay | Admitting: Family Medicine

## 2023-05-16 VITALS — BP 112/79 | HR 79 | Temp 97.0°F | Ht 65.0 in | Wt 205.4 lb

## 2023-05-16 DIAGNOSIS — D126 Benign neoplasm of colon, unspecified: Secondary | ICD-10-CM | POA: Diagnosis not present

## 2023-05-16 DIAGNOSIS — Z3041 Encounter for surveillance of contraceptive pills: Secondary | ICD-10-CM

## 2023-05-16 DIAGNOSIS — F40298 Other specified phobia: Secondary | ICD-10-CM | POA: Insufficient documentation

## 2023-05-16 DIAGNOSIS — Z0000 Encounter for general adult medical examination without abnormal findings: Secondary | ICD-10-CM | POA: Diagnosis not present

## 2023-05-16 DIAGNOSIS — F339 Major depressive disorder, recurrent, unspecified: Secondary | ICD-10-CM

## 2023-05-16 DIAGNOSIS — K9089 Other intestinal malabsorption: Secondary | ICD-10-CM | POA: Insufficient documentation

## 2023-05-16 NOTE — Patient Instructions (Addendum)
 Please return in 12 months for your annual complete physical; please come fasting. We will do your pap smear and blood work at that time.   If you have any questions or concerns, please don't hesitate to send me a message via MyChart or call the office at [phone]. Thank you for visiting with [place] today! It's our pleasure caring for you.   VISIT SUMMARY:  Today, we discussed your ongoing issues with diarrhea following your gallbladder removal, your current mental health treatment, contraceptive management, and your phobia of blood draws. We also reviewed your general health maintenance and lifestyle habits.  YOUR PLAN:  -POSTCHOLECYSTECTOMY DIARRHEA: This is diarrhea that occurs after the removal of the gallbladder. You should continue with your current medication regimen as advised by your gastroenterologist and monitor your symptoms, adjusting the medication dosage as needed.  -DEPRESSION: Your mood is stable with your current medications, Auvelity and Lamictal. Please continue taking these medications as prescribed by your psychiatrist and monitor your mood, reporting any changes.  -CONTRACEPTIVE MANAGEMENT: You are using Sprintec for contraception and to manage perimenopausal symptoms without any adverse effects. Continue taking Sprintec as prescribed.  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 We discussed the possibility of prescribing Xanax to use before blood draws and exploring alternative locations for the procedure. Additionally, consider anxiety management techniques such as meditation or acupuncture.  -GENERAL HEALTH MAINTENANCE: You are up to date with your mammograms and eye exams. Your cholesterol and A1c levels are at the upper limit of normal. Continue with your weightlifting and walking routines, and consider dietary changes to improve your cholesterol and A1c levels.  INSTRUCTIONS:  Please schedule a Pap smear and colonoscopy next year. Continue  monitoring your symptoms and mood, and report any changes to your healthcare providers.

## 2023-05-16 NOTE — Progress Notes (Signed)
 Subjective  Chief Complaint  Patient presents with   Annual Exam    Pt here for Annual Exam and is not currently fasting     HPI: Elaine Casey is a 48 y.o. female who presents to [facility] [place] today for a Female Wellness Visit. She also has the concerns and/or needs as listed above in the chief complaint. These will be addressed in addition to the Health Maintenance Visit.   Wellness Visit: annual visit with health maintenance review and exam  Health maintenance: Mammogram is current.  Pap smear will be due next year.  Colonoscopy due next year for 3-year follow-up surveillance after finding a serrated colon polyp in 2023.  She physically is feeling well.  Started exercising and strength training with a trainer.  Has her dissertation coming up next week, she is a couples therapist, and Artist.  Loves her work.  Immunizations are current.  Eye exam current Chronic disease f/u and/or acute problem visit: (deemed necessary to be done in addition to the wellness visit): Discussed the use of AI scribe software for clinical note transcription with the patient, who gave verbal consent to proceed.  History of Present Illness   The patient is a 48 year old who presents with diarrhea following gallbladder removal.  She has been experiencing daily diarrhea since her gallbladder removal in February, which she describes as 'gallbladder diarrhea.' The symptoms have intensified from late summer to early fall. She has been attempting to adjust her medication dosage to manage the symptoms but continues to struggle. A stool sample was taken to rule out other causes.  Taking Colestid.  Depression is well-controlled.  She is currently taking Auvelity and Lamictal, having switched from Wellbutrin and Lexapro. She describes Auvelity as a combination of Wellbutrin and a component of cough syrup and reports it is working 'well enough.'  She has a significant phobia of blood  draws, which has been a barrier to completing routine blood work. She describes past traumatic experiences with a specific phlebotomist, which have exacerbated her anxiety about the procedure.  She prefers to defer blood work this year.  We reviewed blood work results from last year showing borderline hyperlipidemia.  She has started weightlifting training sessions and is trying to walk more to manage her health. She finds diet changes challenging, particularly reducing chocolate intake, but notes a positive impact on her mental health from the exercise.       Assessment  1. Annual physical exam   2. Sessile serrated polyp of colon   3. Oral contraceptive use   4. Major depression, recurrent, chronic (HCC)   5. Needle phobia   6. Bile salt-induced diarrhea      Plan  Female Wellness Visit: Age appropriate Health Maintenance and Prevention measures were discussed with patient. Included topics are cancer screening recommendations, ways to keep healthy (see AVS) including dietary and exercise recommendations, regular eye and dental care, use of seat belts, and avoidance of moderate alcohol use and tobacco use.  BMI: discussed patient's BMI and encouraged positive lifestyle modifications to help get to or maintain a target BMI. HM needs and immunizations were addressed and ordered. See below for orders. See HM and immunization section for updates. Routine labs and screening tests ordered including cmp, cbc and lipids where appropriate. Discussed recommendations regarding Vit D and calcium supplementation (see AVS)  Chronic disease management visit and/or acute problem visit: Bile salt diarrhea per GI.  Colestid.  Hopefully will improve  Depression is well-controlled per psychiatry Severe blood draw phobia: Will defer labs this year.  Work on diet and exercise.  Will recheck next year. Continue oral contraceptives through menopause. Follow up: 12 months for complete physical No orders of the  defined types were placed in this encounter.  No orders of the defined types were placed in this encounter.     Body mass index is 34.18 kg/m. Wt Readings from Last 3 Encounters:  05/16/23 205 lb 6.4 oz (93.2 kg)  12/19/22 204 lb (92.5 kg)  05/11/22 202 lb 12.8 oz (92 kg)     Patient Active Problem List   Diagnosis Date Noted Date Diagnosed   Sessile serrated polyp of colon 05/16/2023     Priority: High    Colonoscopy Dr. Leonides Schanz 2023. Recheck 3 years    Needle phobia 05/16/2023     Priority: High    Really blood draw phobia; no problem with vaccines or injections    Major depression, recurrent, chronic (HCC) 01/11/2021     Priority: High   OSA (obstructive sleep apnea) - mild, no CPAP 09/02/2019     Priority: High   Oral contraceptive use 01/11/2021     Priority: Medium    Bile salt-induced diarrhea 05/16/2023    Health Maintenance  Topic Date Due   COVID-19 Vaccine (4 - 2024-25 season) 06/01/2023 (Originally 11/06/2022)   INFLUENZA VACCINE  06/05/2023 (Originally 10/06/2022)   Colonoscopy  03/18/2024   Cervical Cancer Screening (HPV/Pap Cotest)  04/01/2024   DTaP/Tdap/Td (2 - Td or Tdap) 04/01/2029   Hepatitis C Screening  Completed   HIV Screening  Completed   HPV VACCINES  Aged Out   Immunization History  Administered Date(s) Administered   Influenza,inj,Quad PF,6+ Mos 01/04/2019, 12/02/2019   Influenza-Unspecified 12/29/2020   PFIZER(Purple Top)SARS-COV-2 Vaccination 04/24/2019, 05/15/2019   Pfizer Covid-19 Vaccine Bivalent Booster 56yrs & up 12/17/2020   Tdap 04/02/2019   We updated and reviewed the patient's past history in detail and it is documented below. Allergies: Patient is allergic to oxycodone. Past Medical History Patient  has a past medical history of Allergy, Calculus of gallbladder with biliary obstruction but without cholecystitis (05/11/2022), Depression, Heart murmur, Hyperlipidemia, and Sleep apnea. Past Surgical History Patient  has a past  surgical history that includes laparoscopic appendectomy (N/A, 12/18/2019); Appendectomy (2021); and Cholecystectomy (2025). Family History: Patient family history includes COPD in her father; Cancer in her maternal grandmother; Colon cancer in her paternal grandmother; Colon polyps in her paternal aunt; Depression in her father; Diabetes in her maternal grandmother and mother; Healthy in her daughter and daughter; Heart disease in her maternal grandmother; High Cholesterol in her maternal grandmother and mother; High blood pressure in her maternal grandmother and mother; Learning disabilities in her father; Stroke in her maternal grandmother. Social History:  Patient  reports that she has never smoked. She has never used smokeless tobacco. She reports that she does not drink alcohol and does not use drugs.  Review of Systems: Constitutional: negative for fever or malaise Ophthalmic: negative for photophobia, double vision or loss of vision Cardiovascular: negative for chest pain, dyspnea on exertion, or new LE swelling Respiratory: negative for SOB or persistent cough Gastrointestinal: negative for abdominal pain, change in bowel habits or melena Genitourinary: negative for dysuria or gross hematuria, no abnormal uterine bleeding or disharge Musculoskeletal: negative for new gait disturbance or muscular weakness Integumentary: negative for new or persistent rashes, no breast lumps Neurological: negative for TIA or stroke symptoms Psychiatric: negative for SI  or delusions Allergic/Immunologic: negative for hives  Patient Care Team    Relationship Specialty Notifications Start End  Willow Ora, MD PCP - General Family Medicine  04/02/19   Milagros Evener, MD Consulting Physician Psychiatry  04/02/19   Imogene Burn, MD Consulting Physician Gastroenterology  05/16/23     Objective  Vitals: BP 112/79   Pulse 79   Temp (!) 97 F (36.1 C)   Ht 5\' 5"  (1.651 m)   Wt 205 lb 6.4 oz (93.2 kg)    SpO2 99%   BMI 34.18 kg/m  General:  Well developed, well nourished, no acute distress  Psych:  Alert and orientedx3,normal mood and affect HEENT:  Normocephalic, atraumatic, non-icteric sclera,  supple neck without adenopathy, mass or thyromegaly Cardiovascular:  Normal S1, S2, RRR without gallop, rub or murmur Respiratory:  Good breath sounds bilaterally, CTAB with normal respiratory effort Gastrointestinal: normal bowel sounds, soft, non-tender, no noted masses. No HSM MSK: extremities without edema, joints without erythema or swelling Neurologic:    Mental status is normal.  Gross motor and sensory exams are normal.  No tremor  Commons side effects, risks, benefits, and alternatives for medications and treatment plan prescribed today were discussed, and the patient expressed understanding of the given instructions. Patient is instructed to call or message via MyChart if he/she has any questions or concerns regarding our treatment plan. No barriers to understanding were identified. We discussed Red Flag symptoms and signs in detail. Patient expressed understanding regarding what to do in case of urgent or emergency type symptoms.  Medication list was reconciled, printed and provided to the patient in AVS. Patient instructions and summary information was reviewed with the patient as documented in the AVS. This note was prepared with assistance of Dragon voice recognition software. Occasional wrong-word or sound-a-like substitutions may have occurred due to the inherent limitations of voice recognition software

## 2023-05-22 DIAGNOSIS — F4323 Adjustment disorder with mixed anxiety and depressed mood: Secondary | ICD-10-CM | POA: Diagnosis not present

## 2023-05-29 DIAGNOSIS — F4323 Adjustment disorder with mixed anxiety and depressed mood: Secondary | ICD-10-CM | POA: Diagnosis not present

## 2023-05-30 DIAGNOSIS — F4323 Adjustment disorder with mixed anxiety and depressed mood: Secondary | ICD-10-CM | POA: Diagnosis not present

## 2023-05-31 DIAGNOSIS — F4323 Adjustment disorder with mixed anxiety and depressed mood: Secondary | ICD-10-CM | POA: Diagnosis not present

## 2023-06-12 DIAGNOSIS — F4323 Adjustment disorder with mixed anxiety and depressed mood: Secondary | ICD-10-CM | POA: Diagnosis not present

## 2023-06-14 DIAGNOSIS — F4323 Adjustment disorder with mixed anxiety and depressed mood: Secondary | ICD-10-CM | POA: Diagnosis not present

## 2023-06-15 ENCOUNTER — Other Ambulatory Visit: Payer: Self-pay

## 2023-06-15 MED ORDER — COLESTIPOL HCL 1 G PO TABS: ORAL_TABLET | ORAL | 3 refills | Status: AC

## 2023-06-24 ENCOUNTER — Other Ambulatory Visit: Payer: Self-pay | Admitting: Physician Assistant

## 2023-06-24 DIAGNOSIS — K9089 Other intestinal malabsorption: Secondary | ICD-10-CM

## 2023-06-26 DIAGNOSIS — F4323 Adjustment disorder with mixed anxiety and depressed mood: Secondary | ICD-10-CM | POA: Diagnosis not present

## 2023-06-28 DIAGNOSIS — F4323 Adjustment disorder with mixed anxiety and depressed mood: Secondary | ICD-10-CM | POA: Diagnosis not present

## 2023-07-03 DIAGNOSIS — F4323 Adjustment disorder with mixed anxiety and depressed mood: Secondary | ICD-10-CM | POA: Diagnosis not present

## 2023-07-04 DIAGNOSIS — F4323 Adjustment disorder with mixed anxiety and depressed mood: Secondary | ICD-10-CM | POA: Diagnosis not present

## 2023-07-05 DIAGNOSIS — F4323 Adjustment disorder with mixed anxiety and depressed mood: Secondary | ICD-10-CM | POA: Diagnosis not present

## 2023-07-10 DIAGNOSIS — F4323 Adjustment disorder with mixed anxiety and depressed mood: Secondary | ICD-10-CM | POA: Diagnosis not present

## 2023-07-11 DIAGNOSIS — F4323 Adjustment disorder with mixed anxiety and depressed mood: Secondary | ICD-10-CM | POA: Diagnosis not present

## 2023-07-17 DIAGNOSIS — F4323 Adjustment disorder with mixed anxiety and depressed mood: Secondary | ICD-10-CM | POA: Diagnosis not present

## 2023-07-18 DIAGNOSIS — Z713 Dietary counseling and surveillance: Secondary | ICD-10-CM | POA: Diagnosis not present

## 2023-07-18 DIAGNOSIS — F4323 Adjustment disorder with mixed anxiety and depressed mood: Secondary | ICD-10-CM | POA: Diagnosis not present

## 2023-07-24 DIAGNOSIS — F4323 Adjustment disorder with mixed anxiety and depressed mood: Secondary | ICD-10-CM | POA: Diagnosis not present

## 2023-07-25 DIAGNOSIS — F4323 Adjustment disorder with mixed anxiety and depressed mood: Secondary | ICD-10-CM | POA: Diagnosis not present

## 2023-07-31 DIAGNOSIS — F4323 Adjustment disorder with mixed anxiety and depressed mood: Secondary | ICD-10-CM | POA: Diagnosis not present

## 2023-08-01 DIAGNOSIS — F4323 Adjustment disorder with mixed anxiety and depressed mood: Secondary | ICD-10-CM | POA: Diagnosis not present

## 2023-08-07 DIAGNOSIS — F4323 Adjustment disorder with mixed anxiety and depressed mood: Secondary | ICD-10-CM | POA: Diagnosis not present

## 2023-08-08 DIAGNOSIS — F4323 Adjustment disorder with mixed anxiety and depressed mood: Secondary | ICD-10-CM | POA: Diagnosis not present

## 2023-08-09 DIAGNOSIS — F4323 Adjustment disorder with mixed anxiety and depressed mood: Secondary | ICD-10-CM | POA: Diagnosis not present

## 2023-08-14 DIAGNOSIS — F4323 Adjustment disorder with mixed anxiety and depressed mood: Secondary | ICD-10-CM | POA: Diagnosis not present

## 2023-08-21 DIAGNOSIS — F331 Major depressive disorder, recurrent, moderate: Secondary | ICD-10-CM | POA: Diagnosis not present

## 2023-08-22 DIAGNOSIS — F4323 Adjustment disorder with mixed anxiety and depressed mood: Secondary | ICD-10-CM | POA: Diagnosis not present

## 2023-08-23 DIAGNOSIS — F4323 Adjustment disorder with mixed anxiety and depressed mood: Secondary | ICD-10-CM | POA: Diagnosis not present

## 2023-08-28 DIAGNOSIS — F4323 Adjustment disorder with mixed anxiety and depressed mood: Secondary | ICD-10-CM | POA: Diagnosis not present

## 2023-09-04 DIAGNOSIS — F4323 Adjustment disorder with mixed anxiety and depressed mood: Secondary | ICD-10-CM | POA: Diagnosis not present

## 2023-09-05 DIAGNOSIS — F4323 Adjustment disorder with mixed anxiety and depressed mood: Secondary | ICD-10-CM | POA: Diagnosis not present

## 2023-09-11 DIAGNOSIS — F4323 Adjustment disorder with mixed anxiety and depressed mood: Secondary | ICD-10-CM | POA: Diagnosis not present

## 2023-09-18 DIAGNOSIS — F4323 Adjustment disorder with mixed anxiety and depressed mood: Secondary | ICD-10-CM | POA: Diagnosis not present

## 2023-09-25 DIAGNOSIS — F4323 Adjustment disorder with mixed anxiety and depressed mood: Secondary | ICD-10-CM | POA: Diagnosis not present

## 2023-10-02 DIAGNOSIS — F4323 Adjustment disorder with mixed anxiety and depressed mood: Secondary | ICD-10-CM | POA: Diagnosis not present

## 2023-10-03 DIAGNOSIS — F4323 Adjustment disorder with mixed anxiety and depressed mood: Secondary | ICD-10-CM | POA: Diagnosis not present

## 2023-10-09 DIAGNOSIS — F4323 Adjustment disorder with mixed anxiety and depressed mood: Secondary | ICD-10-CM | POA: Diagnosis not present

## 2023-10-16 DIAGNOSIS — F4323 Adjustment disorder with mixed anxiety and depressed mood: Secondary | ICD-10-CM | POA: Diagnosis not present

## 2023-10-21 ENCOUNTER — Other Ambulatory Visit: Payer: Self-pay | Admitting: Family Medicine

## 2023-10-23 DIAGNOSIS — F4323 Adjustment disorder with mixed anxiety and depressed mood: Secondary | ICD-10-CM | POA: Diagnosis not present

## 2023-10-30 DIAGNOSIS — F4323 Adjustment disorder with mixed anxiety and depressed mood: Secondary | ICD-10-CM | POA: Diagnosis not present

## 2023-11-06 DIAGNOSIS — F4323 Adjustment disorder with mixed anxiety and depressed mood: Secondary | ICD-10-CM | POA: Diagnosis not present

## 2023-11-07 DIAGNOSIS — F4323 Adjustment disorder with mixed anxiety and depressed mood: Secondary | ICD-10-CM | POA: Diagnosis not present

## 2023-11-15 DIAGNOSIS — F4323 Adjustment disorder with mixed anxiety and depressed mood: Secondary | ICD-10-CM | POA: Diagnosis not present

## 2023-11-20 DIAGNOSIS — F4323 Adjustment disorder with mixed anxiety and depressed mood: Secondary | ICD-10-CM | POA: Diagnosis not present

## 2023-11-21 ENCOUNTER — Encounter (HOSPITAL_BASED_OUTPATIENT_CLINIC_OR_DEPARTMENT_OTHER): Admitting: Certified Nurse Midwife

## 2023-11-21 DIAGNOSIS — F3342 Major depressive disorder, recurrent, in full remission: Secondary | ICD-10-CM | POA: Diagnosis not present

## 2023-12-06 ENCOUNTER — Other Ambulatory Visit: Payer: Self-pay | Admitting: Family Medicine

## 2023-12-06 DIAGNOSIS — Z1231 Encounter for screening mammogram for malignant neoplasm of breast: Secondary | ICD-10-CM

## 2023-12-11 DIAGNOSIS — F4323 Adjustment disorder with mixed anxiety and depressed mood: Secondary | ICD-10-CM | POA: Diagnosis not present

## 2023-12-14 ENCOUNTER — Encounter

## 2023-12-14 DIAGNOSIS — Z1231 Encounter for screening mammogram for malignant neoplasm of breast: Secondary | ICD-10-CM

## 2023-12-18 DIAGNOSIS — F4323 Adjustment disorder with mixed anxiety and depressed mood: Secondary | ICD-10-CM | POA: Diagnosis not present

## 2023-12-20 ENCOUNTER — Encounter: Payer: Self-pay | Admitting: Family Medicine

## 2023-12-22 ENCOUNTER — Other Ambulatory Visit: Payer: Self-pay

## 2023-12-22 DIAGNOSIS — D229 Melanocytic nevi, unspecified: Secondary | ICD-10-CM

## 2023-12-25 ENCOUNTER — Other Ambulatory Visit: Payer: Self-pay | Admitting: Family Medicine

## 2023-12-25 DIAGNOSIS — F4323 Adjustment disorder with mixed anxiety and depressed mood: Secondary | ICD-10-CM | POA: Diagnosis not present

## 2023-12-25 DIAGNOSIS — Z1231 Encounter for screening mammogram for malignant neoplasm of breast: Secondary | ICD-10-CM

## 2023-12-26 ENCOUNTER — Encounter: Payer: Self-pay | Admitting: Physician Assistant

## 2023-12-26 ENCOUNTER — Ambulatory Visit: Admitting: Physician Assistant

## 2023-12-26 VITALS — BP 117/76 | HR 91

## 2023-12-26 DIAGNOSIS — L814 Other melanin hyperpigmentation: Secondary | ICD-10-CM | POA: Diagnosis not present

## 2023-12-26 DIAGNOSIS — D489 Neoplasm of uncertain behavior, unspecified: Secondary | ICD-10-CM

## 2023-12-26 DIAGNOSIS — W908XXA Exposure to other nonionizing radiation, initial encounter: Secondary | ICD-10-CM

## 2023-12-26 DIAGNOSIS — D229 Melanocytic nevi, unspecified: Secondary | ICD-10-CM

## 2023-12-26 DIAGNOSIS — D1801 Hemangioma of skin and subcutaneous tissue: Secondary | ICD-10-CM

## 2023-12-26 DIAGNOSIS — Z1283 Encounter for screening for malignant neoplasm of skin: Secondary | ICD-10-CM

## 2023-12-26 DIAGNOSIS — L821 Other seborrheic keratosis: Secondary | ICD-10-CM | POA: Diagnosis not present

## 2023-12-26 DIAGNOSIS — L820 Inflamed seborrheic keratosis: Secondary | ICD-10-CM | POA: Diagnosis not present

## 2023-12-26 DIAGNOSIS — L578 Other skin changes due to chronic exposure to nonionizing radiation: Secondary | ICD-10-CM

## 2023-12-26 DIAGNOSIS — L570 Actinic keratosis: Secondary | ICD-10-CM | POA: Diagnosis not present

## 2023-12-26 NOTE — Patient Instructions (Addendum)

## 2023-12-26 NOTE — Progress Notes (Unsigned)
   New Patient Visit   Subjective  Elaine Casey is a 48 y.o. female who presents for the following:  Total Body Skin Exam (TBSE)  Patient present today for new patient visit for TBSE.The patient reports she has spots, moles and lesions to be evaluated in the middle of her chest that has been there for one year, some may be new or changing and the patient may have concern these could be cancer. Patient has not previously been treated by a dermatologist.Patient reports she does not have hx of bx. Patient denies family history of skin cancers. Patient reports throughout her lifetime has had moderate sun exposure. Currently, patient reports if she has excessive sun exposure, she does apply sunscreen and/or wears protective coverings.  The following portions of the chart were reviewed this encounter and updated as appropriate: medications, allergies, medical history  Review of Systems:  No other skin or systemic complaints except as noted in HPI or Assessment and Plan.  Objective  Well appearing patient in no apparent distress; mood and affect are within normal limits.  A full examination was performed including scalp, head, eyes, ears, nose, lips, neck, chest, axillae, abdomen, back, buttocks, bilateral upper extremities, bilateral lower extremities, hands, feet, fingers, toes, fingernails, and toenails. All findings within normal limits unless otherwise noted below.     Relevant exam findings are noted in the Assessment and Plan.  Chest - Medial (Center) 1.0 cm pink scaly patch   Assessment & Plan   LENTIGINES, SEBORRHEIC KERATOSES, HEMANGIOMAS - Benign normal skin lesions - Benign-appearing - Call for any changes  MELANOCYTIC NEVI - Tan-brown and/or pink-flesh-colored symmetric macules and papules - Benign appearing on exam today - Observation - Call clinic for new or changing moles - Recommend daily use of broad spectrum spf 30+ sunscreen to sun-exposed areas.   ACTINIC  DAMAGE - Chronic condition, secondary to cumulative UV/sun exposure - diffuse scaly erythematous macules with underlying dyspigmentation - Recommend daily broad spectrum sunscreen SPF 30+ to sun-exposed areas, reapply every 2 hours as needed.  - Staying in the shade or wearing long sleeves, sun glasses (UVA+UVB protection) and wide brim hats (4-inch brim around the entire circumference of the hat) are also recommended for sun protection.  - Call for new or changing lesions.  SKIN CANCER SCREENING PERFORMED TODAY NEOPLASM OF UNCERTAIN BEHAVIOR Chest - Medial (Center) Skin / nail biopsy Type of biopsy: tangential   Informed consent: discussed and consent obtained   Timeout: patient name, date of birth, surgical site, and procedure verified   Procedure prep:  Patient was prepped and draped in usual sterile fashion Prep type:  Isopropyl alcohol Anesthesia: the lesion was anesthetized in a standard fashion   Anesthetic:  1% lidocaine  w/ epinephrine 1-100,000 buffered w/ 8.4% NaHCO3 Instrument used: flexible razor blade   Hemostasis achieved with: pressure, aluminum chloride and electrodesiccation   Outcome: patient tolerated procedure well   Post-procedure details: sterile dressing applied and wound care instructions given   Dressing type: bandage and petrolatum    Specimen 1 - Surgical pathology Differential Diagnosis: R/O BCC VS lichenoid keratosis  Check Margins: No  Return in about 1 year (around 12/25/2024) for TBSE follow up.  I, Doyce Pan, CMA, am acting as scribe for Allycia Pitz K, PA-C.   Documentation: I have reviewed the above documentation for accuracy and completeness, and I agree with the above.  Phelix Fudala K, PA-C

## 2023-12-27 ENCOUNTER — Encounter: Payer: Self-pay | Admitting: Physician Assistant

## 2023-12-28 ENCOUNTER — Ambulatory Visit: Payer: Self-pay | Admitting: Physician Assistant

## 2023-12-28 LAB — SURGICAL PATHOLOGY

## 2024-01-02 DIAGNOSIS — F4323 Adjustment disorder with mixed anxiety and depressed mood: Secondary | ICD-10-CM | POA: Diagnosis not present

## 2024-01-09 ENCOUNTER — Ambulatory Visit
Admission: RE | Admit: 2024-01-09 | Discharge: 2024-01-09 | Disposition: A | Discharge status: Home / Self Care | Source: Ambulatory Visit

## 2024-01-09 DIAGNOSIS — Z1231 Encounter for screening mammogram for malignant neoplasm of breast: Secondary | ICD-10-CM

## 2024-01-09 DIAGNOSIS — F4323 Adjustment disorder with mixed anxiety and depressed mood: Secondary | ICD-10-CM | POA: Diagnosis not present

## 2024-01-23 DIAGNOSIS — F4323 Adjustment disorder with mixed anxiety and depressed mood: Secondary | ICD-10-CM | POA: Diagnosis not present

## 2024-02-06 DIAGNOSIS — F4323 Adjustment disorder with mixed anxiety and depressed mood: Secondary | ICD-10-CM | POA: Diagnosis not present

## 2024-02-15 ENCOUNTER — Encounter (HOSPITAL_BASED_OUTPATIENT_CLINIC_OR_DEPARTMENT_OTHER): Admitting: Certified Nurse Midwife

## 2024-02-19 DIAGNOSIS — F4323 Adjustment disorder with mixed anxiety and depressed mood: Secondary | ICD-10-CM | POA: Diagnosis not present

## 2024-02-20 DIAGNOSIS — F4323 Adjustment disorder with mixed anxiety and depressed mood: Secondary | ICD-10-CM | POA: Diagnosis not present

## 2024-02-27 DIAGNOSIS — F4323 Adjustment disorder with mixed anxiety and depressed mood: Secondary | ICD-10-CM | POA: Diagnosis not present

## 2024-03-04 DIAGNOSIS — F4323 Adjustment disorder with mixed anxiety and depressed mood: Secondary | ICD-10-CM | POA: Diagnosis not present

## 2024-03-05 DIAGNOSIS — F4323 Adjustment disorder with mixed anxiety and depressed mood: Secondary | ICD-10-CM | POA: Diagnosis not present

## 2024-05-16 ENCOUNTER — Encounter: Admitting: Family Medicine
